# Patient Record
Sex: Female | Born: 1980 | Hispanic: Yes | Marital: Single | Smoking: Never smoker
Health system: Southern US, Community
[De-identification: ages and names within clinical notes are randomized; demographics above are authoritative.]

## PROBLEM LIST (undated history)

## (undated) ENCOUNTER — Inpatient Hospital Stay (HOSPITAL_COMMUNITY): Payer: Self-pay

## (undated) DIAGNOSIS — IMO0002 Reserved for concepts with insufficient information to code with codable children: Secondary | ICD-10-CM

## (undated) HISTORY — DX: Reserved for concepts with insufficient information to code with codable children: IMO0002

---

## 2001-08-30 ENCOUNTER — Ambulatory Visit (HOSPITAL_COMMUNITY): Admission: RE | Admit: 2001-08-30 | Discharge: 2001-08-30 | Payer: Self-pay | Admitting: *Deleted

## 2001-12-31 ENCOUNTER — Encounter (HOSPITAL_COMMUNITY): Admission: AD | Admit: 2001-12-31 | Discharge: 2001-12-31 | Payer: Self-pay | Admitting: *Deleted

## 2002-01-05 ENCOUNTER — Inpatient Hospital Stay (HOSPITAL_COMMUNITY): Admission: AD | Admit: 2002-01-05 | Discharge: 2002-01-09 | Payer: Self-pay | Admitting: *Deleted

## 2004-08-23 ENCOUNTER — Other Ambulatory Visit: Admission: RE | Admit: 2004-08-23 | Discharge: 2004-08-23 | Payer: Self-pay | Admitting: Gynecology

## 2005-09-01 ENCOUNTER — Other Ambulatory Visit: Admission: RE | Admit: 2005-09-01 | Discharge: 2005-09-01 | Payer: Self-pay | Admitting: Gynecology

## 2006-10-16 ENCOUNTER — Other Ambulatory Visit: Admission: RE | Admit: 2006-10-16 | Discharge: 2006-10-16 | Payer: Self-pay | Admitting: Gynecology

## 2008-06-20 ENCOUNTER — Encounter (INDEPENDENT_AMBULATORY_CARE_PROVIDER_SITE_OTHER): Payer: Self-pay | Admitting: Obstetrics and Gynecology

## 2008-06-20 ENCOUNTER — Inpatient Hospital Stay (HOSPITAL_COMMUNITY): Admission: RE | Admit: 2008-06-20 | Discharge: 2008-06-23 | Payer: Self-pay | Admitting: Obstetrics and Gynecology

## 2010-06-19 LAB — URINE MICROSCOPIC-ADD ON

## 2010-06-19 LAB — CBC
HCT: 29.9 % — ABNORMAL LOW (ref 36.0–46.0)
HCT: 39.1 % (ref 36.0–46.0)
Hemoglobin: 10.4 g/dL — ABNORMAL LOW (ref 12.0–15.0)
Hemoglobin: 13.6 g/dL (ref 12.0–15.0)
MCHC: 34.8 g/dL (ref 30.0–36.0)
MCHC: 35 g/dL (ref 30.0–36.0)
MCV: 96.6 fL (ref 78.0–100.0)
MCV: 97.4 fL (ref 78.0–100.0)
Platelets: 183 10*3/uL (ref 150–400)
Platelets: 216 10*3/uL (ref 150–400)
RBC: 3.07 MIL/uL — ABNORMAL LOW (ref 3.87–5.11)
RBC: 4.05 MIL/uL (ref 3.87–5.11)
RDW: 13.3 % (ref 11.5–15.5)
RDW: 13.6 % (ref 11.5–15.5)
WBC: 11.2 10*3/uL — ABNORMAL HIGH (ref 4.0–10.5)
WBC: 11.3 10*3/uL — ABNORMAL HIGH (ref 4.0–10.5)

## 2010-06-19 LAB — URINALYSIS, ROUTINE W REFLEX MICROSCOPIC
Bilirubin Urine: NEGATIVE
Glucose, UA: NEGATIVE mg/dL
Ketones, ur: 15 mg/dL — AB
Nitrite: NEGATIVE
Protein, ur: NEGATIVE mg/dL
Specific Gravity, Urine: 1.01 (ref 1.005–1.030)
Urobilinogen, UA: 0.2 mg/dL (ref 0.0–1.0)
pH: 6.5 (ref 5.0–8.0)

## 2010-06-19 LAB — RPR: RPR Ser Ql: NONREACTIVE

## 2010-07-23 NOTE — Op Note (Signed)
NAME:  Ashley Lopez, Ashley Lopez           ACCOUNT NO.:  [id number]   MEDICAL RECORD NO.:  [id number]          PATIENT TYPE:  INP   LOCATION:  9122                          FACILITY:  [facility]   PHYSICIAN:  Randye Lobo, M.D.   DATE OF BIRTH:  [birth year]   DATE OF PROCEDURE:  06/20/2008  DATE OF DISCHARGE:                               OPERATIVE REPORT   PREOPERATIVE DIAGNOSES:  1. Intrauterine gestation at 41 +1 weeks.  2. History of prior cesarean section.   POSTOPERATIVE DIAGNOSES:  1. Intrauterine gestation at 41 +1 weeks.  2. History of prior cesarean section.  3. Thick meconium-stained amniotic fluid.   PROCEDURE:  Repeat low segment transverse cesarean section.   SURGEON:  Randye Lobo, MD   ANESTHESIA:  Spinal.   IV FLUIDS:  3500 mL Ringer's lactate.   ESTIMATED BLOOD LOSS:  700 mL.   URINE OUTPUT:  300 mL.   COMPLICATIONS:  None.   INDICATIONS FOR PROCEDURE:  The patient is a 30 year old gravida 2, para  1-0-0-1 Hispanic female at 63 plus one weeks' gestation who now presents  post dates with absence of labor and the presence of an unfavorable  cervix.  The patient had a biophysical profile on April 9, which was 8/8  and estimated fetal weight at that time was 3260 g with an AFI of 16.85.  The patient's cervix was closed and 20% effaced.  The patient now  presents for a repeat cesarean section as she has not gone into  spontaneous labor and the patient agrees to proceed after risks,  benefits, and alternatives are reviewed.   FINDINGS:  A viable female was delivered at 14:32 with Apgars of 8 at one  minute and 9 at five minutes.  The weight was 7 pounds and 10 ounces.  The amniotic fluid had thick meconium staining.  The left fallopian tube  was adherent to the left anterior abdominal wall.  The fallopian tubes,  ovaries, and uterus were otherwise unremarkable.   SPECIMENS:  The placenta was sent to pathology.   DESCRIPTION OF PROCEDURE:  The patient was reidentified  in the  preoperative hold area.  She did receive Ancef for IV antibiotic  prophylaxis.  In the operating room, a spinal anesthetic was  administered and the patient was placed in the supine position with the  left lateral tilt.  The abdomen was sterilely prepped and the Foley  catheter was placed inside the bladder.  She was sterilely draped.   An Allis test was performed and the patient had adequate surgical  anesthesia.   A Pfannenstiel incision was created sharply with a scalpel along the  line of the patient's previous incision.  The incision was carried down  to the fascia using a sharp dissection with a scalpel.  The fascia was  incised in the midline.  The fascial incision was extended bilaterally  with a Mayo scissors.  The fascia was then dissected from the rectus  muscles superiorly and inferiorly.  The rectus muscles were divided.  The parietal peritoneum was entered, and the peritoneal incision was  extended cranially and caudally.  The lower uterine segment was exposed and a bladder flap was sharply  created.  The bladder flap was noted to be high along the lower uterine  segment.   A transverse lower uterine segment incision was then created sharply  with a scalpel.  The incision was extended bluntly.  Meconium stained  amniotic fluid was noted behind the membranes.  Membranes were ruptured  at this time.  A hand was inserted through the uterine incision and the  vertex was elevated.  Fundal pressure was applied; however the vertex  did not want to deliver with only fundal pressure.  A Mityvac was  therefore applied.  There were two pop offs.  Tissue dystocia from the  rectus muscles and the skin incision were appreciated and the rectus  muscles were therefore sharply partially bisected bilaterally and the  skin incision was also extended bilaterally.  The Mityvac was then  applied and the vertex was then delivered without difficulty.  The DeLee  suction was used to  suction the nares in the mouth.  The remainder of  the newborn was delivered, and the newborn was noted to be in vigorous  condition.  The umbilical cord was doubly clamped and cut.  The newborn  was carried over to the awaiting pediatricians.   The placenta was manually extracted and sent to pathology at this time.  The uterus was exteriorized at this time and it was wiped, cleaned with  a moistened lap pad.  The uterine incision was closed with a double  layer closure of #1 chromic.  The first was a running locked layer and  the second was an imbricating layer.  There was some bleeding of a  branch of the uterine artery on the patient's left-hand side.  A hand  was inserted behind the broad ligament and a O'Leary stitch with #1  chromic was performed without difficulty and this provided good  hemostasis.   The uterus was returned to the peritoneal cavity at this time.  The  adhesion between the fallopian tube and the peritoneum was appreciated  at this time and it was sharply lysed leaving some parietal peritoneum  on the of fallopian tube.  This was oozing slightly and was cauterized  very minimally.   The pelvis was irrigated and suctioned at this time and there was good  hemostasis and so the abdomen was therefore closed.  The parietal  peritoneum was closed with a running suture of 3-0 Vicryl.  The rectus  muscles were reconstructed with figure-of-eight sutures of #1 chromic.  The rectus muscles were then reapproximated in the midline with  interrupted and figure-of-eight sutures of #1 chromic.  The fascia was  next closed with a running suture of 0 Vicryl.   There was retraction of the subcutaneous layer and this was released by  undermining the subcutaneous tissue superiorly and inferiorly using  monopolar cautery to create good hemostasis.  The skin was then closed  with staples and a sterile pressure bandage was placed over this.   This concluded the patient's procedure.   There were no complications.  All needle, instrument, sponge counts were correct.  The patient was  escorted to the recovery room in stable and awake condition.      Randye Lobo, M.D.  Electronically Signed     BES/MEDQ  D:  06/20/2008  T:  06/21/2008  Job:  [id number]

## 2010-07-26 NOTE — Discharge Summary (Signed)
Ashley Lopez, Ashley Lopez           ACCOUNT NO.:  [id number]   MEDICAL RECORD NO.:  [id number]          PATIENT TYPE:  INP   LOCATION:  9122                          [facility]ACILITY:  [facility]   PHYSICIAN:  Randye Lobo, M.D.   DATE OF BIRTH:  [birth year]   DATE OF ADMISSION:  06/20/2008  DATE OF DISCHARGE:  06/23/2008                               DISCHARGE SUMMARY   FINAL DIAGNOSES:  1. Intrauterine gestation at 41-1/7 weeks' gestation.  2. History of prior cesarean section.  3. Unfavorable cervix.  4. Thick meconium-stained amniotic fluid.   PROCEDURE:  Repeat low transverse cesarean section.   SURGEON:  Randye Lobo, MD   COMPLICATIONS:  None.   This 30 year old G2, P1-0-0-1 presents at 41-1/7 weeks' gestation with a  history of a prior Cesarean Section and being post dates with an  unfavorable cervix.  The patient did have a biophysical profile on June 16, 2008, which was 8/8 and showed an estimated fetal weight of about  3260 g and a normal AFI.  The patient's cervix was closed.  After  consent was performed, the decision was made to proceed with the repeat  Cesarean section.  The patient's antepartum course up to this point had  been uncomplicated.  The patient was taken to the operating room on  June 20, 2008, by Dr. Conley Simmonds where a repeat low segment transverse  Cesarean section was performed with the delivery of a 7 pounds 10 ounces  female infant with Apgars of 8 and 9.  The left fallopian tube was  adherent to the left anterior abdominal wall.  Procedure went without  complications.  The patient's postoperative course was benign without  any significant fevers.  The patient was felt ready for discharge on  postoperative day #3.  She was sent home on a regular diet, told to  decrease activities, told to continue her prenatal vitamins, was given a  prescription for Percocet 1-2 every 4-6 hours as needed for pain, told  she could use ibuprofen up to 600 mg every 6 hours as  needed for pain,  was to follow up in our office in 4-6 weeks.  Instructions and  precautions were reviewed with the patient.   LABORATORY DATA ON DISCHARGE:  The patient had a hemoglobin of 10.4,  white blood cell count of 11.2, and platelets of 183,000.      Leilani Able, P.A.-C.      Randye Lobo, M.D.  Electronically Signed   MB/MEDQ  D:  07/17/2008  T:  07/18/2008  Job:  [id number]

## 2010-07-26 NOTE — Discharge Summary (Signed)
   Ashley Lopez, Ashley Lopez                              ACCOUNT NO.:  [id number]   MEDICAL RECORD NO.:  [id number]                   PATIENT TYPE:  INP   LOCATION:  9104                                 [facility]   PHYSICIAN:  Maryelizabeth Rowan, M.D.               DATE OF BIRTH:  01-Jun-1980   DATE OF ADMISSION:  01/05/2002  DATE OF DISCHARGE:  01/09/2002                                 DISCHARGE SUMMARY   DISCHARGE DIAGNOSIS:  Status post low transverse cesarean section with  delivery of term female baby.   HOSPITAL COURSE:  This 30 year old Hispanic female was admitted for  induction of labor and post dates.  Her course was complicated by arrest of  dilatation.  She was then taken to the OR for a low transverse C-section.  Postoperative course was uneventful.  The patient was up and ambulating  well.   PROCEDURE:  A low transverse C-section was performed on January 06, 2002.   CONDITION ON DISCHARGE:  The patient was discharged home in stable  condition.   DISCHARGE MEDICATIONS:  Depo-Provera injection prior to discharge, Percocet  5/325 one to two q.6h. p.r.n., and FES04 t.i.d., Colace 100 mg p.o. b.i.d.   FOLLOW UP:  Follow up at [facility] in six weeks.   DISCHARGE LABORATORY DATA:  Hemoglobin on January 06, 2002, of 9.7.                                               Maryelizabeth Rowan, M.D.    ED/MEDQ  D:  01/09/2002  T:  01/10/2002  Job:  [id number]

## 2010-07-26 NOTE — Op Note (Signed)
Ashley Lopez, Ashley Lopez                              ACCOUNT NO.:  [id number]   MEDICAL RECORD NO.:  [id number]                   PATIENT TYPE:  INP   LOCATION:  9104                                 [facility]ACILITY[facility]   PHYSICIAN:  Mary Sella. Orlene Erm, M.D.                 DATE OF BIRTH:  1980/03/13   DATE OF PROCEDURE:  01/06/2002  DATE OF DISCHARGE:                                 OPERATIVE REPORT   PREOPERATIVE DIAGNOSIS:  A 30 year old, gravida 1, at 41 weeks  4 days with  arrested dilatation.   POSTOPERATIVE DIAGNOSIS:  A 30 year old, gravida 1, at 41 weeks 4 days with  arrested dilatation.   PROCEDURE:  Primary low-transverse cesarean section.   SURGEON:  Mary Sella. Orlene Erm, M.D.   ASSISTANT:  Marlinda Mike, C.N.M.   ANESTHESIA:  Epidural.   COMPLICATIONS:  None.   ESTIMATED BLOOD LOSS:  800 cc.   FINDINGS:  A viable female infant delivered at 2158 with Apgars of  7/8,  weighing 6 pounds 8 ounces, normal uterus, tubes and ovaries.   DISPOSITION:  To recovery room, stable.   INDICATIONS FOR PROCEDURE:  The patient is a 30 year old Hispanic female who  presented on January 05, 2002, for induction of labor. The patient was begun  on Cervidil and continued to have her cervix ripen through January 06, 2002.  On that morning, she was 1 cm and 70%.  Artifical rupture of membranes was  performed which revealed thin meconium. The patient was internalized with  IUPC and Pitocin was begun. During the day, she had difficulty obtaining  adequate labor without having fetal heart rate deceleration. The patient was  eventually in adequate labor for several hours during the day; however, she  changed her cervix going to 5-6 cm. There was __________  of caput and  molding noted. The baby was in occiput transverse presentation. The decision  was made to perform primary low-transverse cesarean section secondary to  arrested dilatation and intolerance of labor. The patient was counseled on  the risk of  bleeding, infection, injury to internal organs, the risk of  transfusion, and emergent hysterectomy. The patient agrees to proceed.   DESCRIPTION OF PROCEDURE:  The patient was taken to the operating room where  epidural anesthesia was bolused. She was prepped and draped in a sterile  fashion. A Pfannenstiel incision was performed and carried down to the  underlying fascia with sharp and blunt dissection. The fascia was entered  sharply and dissected laterally with Mayo scissors. The fascia was separated  from the underlying rectus muscle with sharp and blunt dissection. The  rectus muscle bellies were separated and the peritoneum was grasped with the  hemostats. This was entered sharply with Metzenbaum scissors. The peritoneum  was dissected superiorly and inferiorly and the peritoneum was stretched.   The bladder blade was placed and bladder flaps were created with sharp  and  blunt dissection. The uterus was then scored with the scalpel and the  incision was carried down in the underlying uterine cavity in the midline.  The surgeon's fingers were used to extend the incision laterally and  superiorly. The head was grasped and delivered through the uterine incision.  The infant was bulb suctioned on the operative field. The infant's body was  delivered and cord was clamped and cut. The infant was then handed off to  the waiting neonatal resuscitation team.   The placenta was then manually extracted and the uterus was curetted with a  dry lap sponge. The uterus was externalized and the uterine incision was  repaired with a running locking suture of #0 chromic. A second imbricating  layered suture was used to repair the incision and obtain final hemostasis.  The uterus was returned to the abdominal cavity and then pericolic gutters  were emptied of clot and debris. The uterine incision was again inspected  and noted to be hemostatic.   The fascial incision was closed with a running stitch  of #0 Vicryl. The  subcutaneous tissue was irrigated with copious amounts of saline and the  skin edges were reapproximated with staples.  Sponge, needle, and instrument  counts were correct at the end of the procedure.   The patient tolerated the procedure well and returned to the recovery room  in stable condition.                                               Mary Sella. Orlene Erm, M.D.    EMH/MEDQ  D:  01/06/2002  T:  01/07/2002  Job:  [id number]

## 2010-09-18 ENCOUNTER — Other Ambulatory Visit: Payer: Self-pay | Admitting: Obstetrics and Gynecology

## 2011-12-16 ENCOUNTER — Other Ambulatory Visit: Payer: Self-pay

## 2012-10-19 ENCOUNTER — Ambulatory Visit: Payer: Self-pay | Admitting: Internal Medicine

## 2012-11-05 ENCOUNTER — Ambulatory Visit: Payer: Self-pay | Admitting: Internal Medicine

## 2012-12-09 ENCOUNTER — Telehealth: Payer: Self-pay

## 2012-12-09 NOTE — Telephone Encounter (Signed)
LM for C/B. 

## 2012-12-10 ENCOUNTER — Encounter: Payer: Self-pay | Admitting: Internal Medicine

## 2012-12-10 ENCOUNTER — Ambulatory Visit: Payer: Self-pay | Admitting: Internal Medicine

## 2012-12-10 ENCOUNTER — Ambulatory Visit (INDEPENDENT_AMBULATORY_CARE_PROVIDER_SITE_OTHER): Payer: BC Managed Care – PPO | Admitting: Internal Medicine

## 2012-12-10 VITALS — BP 100/66 | HR 82 | Temp 98.5°F | Ht 61.0 in | Wt 105.0 lb

## 2012-12-10 DIAGNOSIS — Z0000 Encounter for general adult medical examination without abnormal findings: Secondary | ICD-10-CM

## 2012-12-10 LAB — CBC WITH DIFFERENTIAL/PLATELET
Basophils Relative: 0.4 % (ref 0.0–3.0)
Eosinophils Absolute: 0 10*3/uL (ref 0.0–0.7)
Eosinophils Relative: 0.4 % (ref 0.0–5.0)
Hemoglobin: 12.8 g/dL (ref 12.0–15.0)
Lymphocytes Relative: 12 % (ref 12.0–46.0)
MCHC: 34.2 g/dL (ref 30.0–36.0)
MCV: 93.2 fl (ref 78.0–100.0)
Neutro Abs: 7.9 10*3/uL — ABNORMAL HIGH (ref 1.4–7.7)
Neutrophils Relative %: 81.6 % — ABNORMAL HIGH (ref 43.0–77.0)
RBC: 4.03 Mil/uL (ref 3.87–5.11)
WBC: 9.7 10*3/uL (ref 4.5–10.5)

## 2012-12-10 LAB — COMPREHENSIVE METABOLIC PANEL
Albumin: 3.9 g/dL (ref 3.5–5.2)
Alkaline Phosphatase: 27 U/L — ABNORMAL LOW (ref 39–117)
BUN: 11 mg/dL (ref 6–23)
Calcium: 8.7 mg/dL (ref 8.4–10.5)
Chloride: 104 mEq/L (ref 96–112)
Glucose, Bld: 77 mg/dL (ref 70–99)
Potassium: 3.3 mEq/L — ABNORMAL LOW (ref 3.5–5.1)
Sodium: 137 mEq/L (ref 135–145)
Total Protein: 7 g/dL (ref 6.0–8.3)

## 2012-12-10 LAB — LIPID PANEL
Cholesterol: 193 mg/dL (ref 0–200)
LDL Cholesterol: 107 mg/dL — ABNORMAL HIGH (ref 0–99)
Total CHOL/HDL Ratio: 3
Triglycerides: 116 mg/dL (ref 0.0–149.0)

## 2012-12-10 NOTE — Progress Notes (Signed)
  Subjective:    Patient ID: Ashley Lopez, female    DOB: [birth year], 32 y.o.   MRN: [id number]  HPI New patient , CPX  No past medical history on file.  Past Surgical History  Procedure Laterality Date  . Cesarean section      x 2   History   Social History  . Marital Status: Single    Spouse Name: N/A    Number of Children: 2  . Years of Education: N/A   Occupational History  . grocery store     Social History Main Topics  . Smoking status: Never Smoker   . Smokeless tobacco: Never Used  . Alcohol Use: No  . Drug Use: No  . Sexual Activity: Not on file   Other Topics Concern  . Not on file   Social History Narrative   Original from [place], [place]   Lives w/ boyfriend         Family History  Problem Relation Age of Onset  . Colon cancer Neg Hx   . Breast cancer Neg Hx   . CAD Father     ??  . Stroke Other     MGM  . Diabetes Neg Hx     Review of Systems Diet-- trying to eat healthy Exercise-- active at work No  CP, SOB, lower extremity edema Denies  nausea, vomiting diarrhea Denies  blood in the stools (-) cough, sputum production (-) wheezing, chest congestion No anxiety, depression  (-) polyurea, polydypsia       Objective:   Physical Exam BP 100/66  Pulse 82  Temp(Src) 98.5 F (36.9 C)  Ht 5\' 1"  (1.549 m)  Wt 105 lb (47.628 kg)  BMI 19.85 kg/m2  SpO2 99% General -- alert, well-developed, NAD.  Neck --no thyromegaly Lungs -- normal respiratory effort, no intercostal retractions, no accessory muscle use, and normal breath sounds.  Heart-- normal rate, regular rhythm, no murmur.  Abdomen-- Not distended, good bowel sounds,soft, non-tender.  Extremities-- no pretibial edema bilaterally  Neurologic--  alert & oriented X3. Speech normal, gait normal, strength normal in all extremities.   Psych-- Cognition and judgment appear intact. Cooperative with normal attention span and concentration. No anxious appearing , no depressed  appearing.       Assessment & Plan:

## 2012-12-10 NOTE — Telephone Encounter (Signed)
Unable to reach prior to visit  

## 2012-12-10 NOTE — Assessment & Plan Note (Signed)
Last Td --- ? 4 years ago when she was pregnant Had a flu shot at work Diet, exercise, calcium and vitamin D discussed Labs Has an appointment to see gynecology. She is somehow concerned about her weight, has lost a few pounds in the last few months, we agreed to do blood work and reassess in 3 months.

## 2012-12-11 ENCOUNTER — Encounter: Payer: Self-pay | Admitting: Internal Medicine

## 2012-12-15 ENCOUNTER — Telehealth: Payer: Self-pay | Admitting: *Deleted

## 2012-12-15 DIAGNOSIS — R7989 Other specified abnormal findings of blood chemistry: Secondary | ICD-10-CM

## 2012-12-15 NOTE — Telephone Encounter (Signed)
Pt scheduled for lab visit 12/17/12  [facility]

## 2012-12-15 NOTE — Addendum Note (Signed)
Addended by: Eustace Quail on: 12/15/2012 02:16 PM   Modules accepted: Orders

## 2012-12-15 NOTE — Telephone Encounter (Signed)
Message copied by Eustace Quail on Wed Dec 15, 2012  2:13 PM ------      Message from: Silvio Pate D      Created: Tue Dec 14, 2012  1:38 PM       This blood work is to old to add labs. The  Patient will have to come back in.       Silvio Pate      ----- Message -----         From: Wanda Plump, MD         Sent: 12/14/2012   1:21 PM           To: Vance Gather            Please add free T4, free T3 --- dx  abnormal TSH       ------

## 2012-12-17 ENCOUNTER — Encounter: Payer: Self-pay | Admitting: *Deleted

## 2012-12-17 ENCOUNTER — Other Ambulatory Visit (INDEPENDENT_AMBULATORY_CARE_PROVIDER_SITE_OTHER): Payer: BC Managed Care – PPO

## 2012-12-17 DIAGNOSIS — R6889 Other general symptoms and signs: Secondary | ICD-10-CM

## 2012-12-17 DIAGNOSIS — R7989 Other specified abnormal findings of blood chemistry: Secondary | ICD-10-CM

## 2012-12-18 LAB — T3, FREE: T3, Free: 3.2 pg/mL (ref 2.3–4.2)

## 2012-12-20 ENCOUNTER — Encounter: Payer: Self-pay | Admitting: *Deleted

## 2012-12-20 NOTE — Progress Notes (Signed)
Letter mailed to patient.

## 2012-12-24 ENCOUNTER — Other Ambulatory Visit: Payer: Self-pay

## 2013-03-16 ENCOUNTER — Other Ambulatory Visit: Payer: Self-pay

## 2013-03-18 ENCOUNTER — Ambulatory Visit: Payer: BC Managed Care – PPO | Admitting: Internal Medicine

## 2013-06-08 ENCOUNTER — Encounter: Payer: Self-pay | Admitting: Internal Medicine

## 2013-06-08 ENCOUNTER — Ambulatory Visit (INDEPENDENT_AMBULATORY_CARE_PROVIDER_SITE_OTHER): Payer: BC Managed Care – PPO | Admitting: Internal Medicine

## 2013-06-08 VITALS — BP 95/62 | HR 60 | Temp 97.7°F | Wt 104.0 lb

## 2013-06-08 DIAGNOSIS — R634 Abnormal weight loss: Secondary | ICD-10-CM | POA: Insufficient documentation

## 2013-06-08 DIAGNOSIS — Z0000 Encounter for general adult medical examination without abnormal findings: Secondary | ICD-10-CM

## 2013-06-08 DIAGNOSIS — Z23 Encounter for immunization: Secondary | ICD-10-CM

## 2013-06-08 LAB — T3, FREE: T3, Free: 3 pg/mL (ref 2.3–4.2)

## 2013-06-08 LAB — TSH: TSH: 0.4 u[IU]/mL (ref 0.35–5.50)

## 2013-06-08 LAB — HEMOGLOBIN A1C: HEMOGLOBIN A1C: 5.2 % (ref 4.6–6.5)

## 2013-06-08 LAB — T4, FREE: Free T4: 0.99 ng/dL (ref 0.60–1.60)

## 2013-06-08 LAB — POTASSIUM: Potassium: 4.1 mEq/L (ref 3.5–5.1)

## 2013-06-08 NOTE — Assessment & Plan Note (Signed)
[facility] Reports frequent vaginal infections, pt thinking about a referral to another gynecologist

## 2013-06-08 NOTE — Progress Notes (Signed)
Pre visit review using our clinic review tool, if applicable. No additional management support is needed unless otherwise documented below in the visit note. 

## 2013-06-08 NOTE — Patient Instructions (Signed)
Get your blood work before you leave   Next visit is for a physical exam by December 2014 ,  fasting Please make an appointment

## 2013-06-08 NOTE — Assessment & Plan Note (Addendum)
Patient report weight loss, per our scales has lost 1 pound since December 2014. She admits to eating small portions, also has some anxiety. TSH was slightly low, does no have any symptoms of hyperthyroidism. Plan: TFTs, A1c per patient request, recheck potassium which was a slightly low. Counseled about anxiety, see a counselor? Pt declines to take any meds

## 2013-06-08 NOTE — Progress Notes (Signed)
   Subjective:    Patient ID: Ashley PandyElvia Lopez, female    DOB: 05/18/1980, 33 y.o.   MRN: [id number]  DOS:  06/08/2013 Type of  visit:  Followup from previous visit   Since the last time she was here, she continued to be concerned about her weight. States she's taking vitamins, admits that she is still eating small portions. On further questioning, she admits to anxiety, mostly related to her relationship with her boyfriend (denies b-friend to be violent or abusive) .   ROS Denies depression or suicidal ideas. Denies diarrhea or tremors States has been losing her hair. Reports frequent vaginal infections, sees gynecology    History reviewed. No pertinent past medical history.  Past Surgical History  Procedure Laterality Date  . Cesarean section      x 2    History   Social History  . Marital Status: Single    Spouse Name: N/A    Number of Children: 2  . Years of Education: N/A   Occupational History  . grocery store     Social History Main Topics  . Smoking status: Never Smoker   . Smokeless tobacco: Never Used  . Alcohol Use: No  . Drug Use: No  . Sexual Activity: Not on file   Other Topics Concern  . Not on file   Social History Narrative   Original from [place]   Lives w/ boyfriend              Medication List       This list is accurate as of: 06/08/13 12:58 PM.  Always use your most recent med list.               REPHRESH PRO-B PO  Take by mouth.           Objective:   Physical Exam BP 95/62  Pulse 60  Temp(Src) 97.7 F (36.5 C)  Wt 104 lb (47.174 kg)  SpO2 100% General -- alert, well-developed, NAD.  Neck --no thyromegaly   Extremities-- no pretibial edema bilaterally  Neurologic--  alert & oriented X3. Speech normal, gait normal, strength normal in all extremities.  No tremors  Psych-- Cognition and judgment appear intact. Cooperative with normal attention span and concentration. No anxious or depressed appearing.      Assessment & Plan:

## 2013-06-09 ENCOUNTER — Encounter: Payer: Self-pay | Admitting: *Deleted

## 2013-12-08 DIAGNOSIS — R87810 Cervical high risk human papillomavirus (HPV) DNA test positive: Secondary | ICD-10-CM | POA: Insufficient documentation

## 2013-12-29 ENCOUNTER — Other Ambulatory Visit: Payer: Self-pay

## 2013-12-30 LAB — CYTOLOGY - PAP

## 2014-01-04 ENCOUNTER — Inpatient Hospital Stay (HOSPITAL_COMMUNITY): Payer: BC Managed Care – PPO

## 2014-01-04 ENCOUNTER — Encounter (HOSPITAL_COMMUNITY): Payer: Self-pay | Admitting: *Deleted

## 2014-01-04 ENCOUNTER — Inpatient Hospital Stay (HOSPITAL_COMMUNITY)
Admission: AD | Admit: 2014-01-04 | Discharge: 2014-01-04 | Disposition: A | Discharge status: Home / Self Care | Payer: BC Managed Care – PPO | Source: Ambulatory Visit | Attending: Obstetrics | Admitting: Obstetrics

## 2014-01-04 DIAGNOSIS — N939 Abnormal uterine and vaginal bleeding, unspecified: Secondary | ICD-10-CM

## 2014-01-04 DIAGNOSIS — Z3A12 12 weeks gestation of pregnancy: Secondary | ICD-10-CM | POA: Diagnosis not present

## 2014-01-04 DIAGNOSIS — O209 Hemorrhage in early pregnancy, unspecified: Secondary | ICD-10-CM | POA: Diagnosis not present

## 2014-01-04 LAB — ABO/RH: ABO/RH(D): O POS

## 2014-01-04 NOTE — MAU Provider Note (Signed)
History     CSN: [id number]  Arrival date and time: 01/04/14 [id number]   First Provider Initiated Contact with Patient 01/04/14 1011      Chief Complaint  Patient presents with  . Abdominal Pain  . Vaginal Bleeding   HPI  [name] is a 33 y.o.female G3P2002 at 4370w4d who presents with vaginal bleeding and abdominal pain that started last night/ this morning around 0200.  She was seen by the RN in her OB dr this morning and was instructed to come to MAU for further evaluation. The office did not have anyone available to do an US so she was sent here for US. No recent intercourse. She attests to occasional abdominal cramping.  Hospital interpretor at bedside.   OB History   Grav Para Term Preterm Abortions TAB SAB Ect Mult Living   3 2 2       2       History reviewed. No pertinent past medical history.  Past Surgical History  Procedure Laterality Date  . Cesarean section      x 2    Family History  Problem Relation Age of Onset  . Colon cancer Neg Hx   . Breast cancer Neg Hx   . CAD Father     ??  . Stroke Other     MGM  . Diabetes Neg Hx     History  Substance Use Topics  . Smoking status: Never Smoker   . Smokeless tobacco: Never Used  . Alcohol Use: No    Allergies: No Known Allergies  Prescriptions prior to admission  Medication Sig Dispense Refill  . metroNIDAZOLE (FLAGYL) 500 MG tablet Take 500 mg by mouth 3 (three) times daily. Pt started on 01/02/14 to take for 7 days.      . Prenatal Vit-Fe Fumarate-FA (PRENATAL MULTIVITAMIN) TABS tablet Take 1 tablet by mouth daily at 12 noon.       Results for orders placed during the hospital encounter of 01/04/14 (from the past 48 hour(s))  ABO/RH     Status: None   Collection Time    01/04/14 10:50 AM      Result Value Ref Range   ABO/RH(D) O POS      Results for orders placed during the hospital encounter of 01/04/14 (from the past 48 hour(s))  ABO/RH     Status: None   Collection Time   01/04/14 10:50 AM      Result Value Ref Range   ABO/RH(D) O POS     [place] Ob Comp Less 14 Wks  01/04/2014   CLINICAL DATA:  Pregnant, vaginal bleeding  EXAM: OBSTETRIC <14 WK ULTRASOUND  TECHNIQUE: Transabdominal ultrasound was performed for evaluation of the gestation as well as the maternal uterus and adnexal regions.  COMPARISON:  None.  FINDINGS: Intrauterine gestational sac: Visualized/normal in shape.  Yolk sac:  Not visualized  Embryo:  Present  Cardiac Activity: Present  Heart Rate: 167 bpm  CRL:   56.3  mm   12 w 2 d                  US EDC: 07/17/2014  Maternal uterus/adnexae: No subchronic hemorrhage.  Right ovary is within normal limits, measuring 2.4 x 2.0 x 1.3 cm.  Left ovary is within normal limits, measuring 2.9 x 2.1 x 1.8 cm.  No free fluid.  IMPRESSION: Single live intrauterine gestation, with estimated gestational age [redacted] weeks 2 days by crown-rump length.   Electronically Signed  By: Charline BillsSriyesh  Krishnan M.D.   On: 01/04/2014 11:54    Review of Systems  Constitutional: Negative for fever and chills.  Gastrointestinal: Positive for abdominal pain (Lower abdominal pain ).  Genitourinary: Negative for dysuria and urgency.       +vaginal discharge mucus like.  + vaginal bleeding. No dysuria.    Physical Exam   Blood pressure 111/70, pulse 98, temperature 98.7 F (37.1 C), temperature source Oral, resp. rate 16, height 5' (1.524 m), weight 46.72 kg (103 lb), SpO2 100.00%.  Physical Exam  Constitutional: She is oriented to person, place, and time. She appears well-developed and well-nourished. No distress.  HENT:  Head: Normocephalic.  Eyes: Pupils are equal, round, and reactive to light.  Neck: Neck supple.  Respiratory: Effort normal.  GI: Soft.  Genitourinary:  Speculum exam: Vagina - Small amount of mucus like, dark red blood in vaginal canal  Cervix - No active bleeding from the cervix Bimanual exam: deferred  Chaperone present for exam.   Musculoskeletal: Normal  range of motion.  Neurological: She is alert and oriented to person, place, and time.  Skin: Skin is warm. She is not diaphoretic.  Psychiatric: Her behavior is normal.    MAU Course  Procedures None  MDM +fht ABO US Discussed plan of care with Dr. Chestine Sporelark. Patient currently in US @1115  Assessment and Plan   A: 1. Vaginal bleeding in first trimester    P: Discharge home in stable condition Bleeding precautions discussed Pelvic rest Return to MAU if symptoms worsen.     Iona HansenJennifer Irene Rasch, NP 01/04/2014 7:21 PM

## 2014-01-04 NOTE — MAU Note (Signed)
Pt coming from MD office.  Bleeding started on Mon, became heavy last night. Having sharp pains in lower abd, not really that bad

## 2014-01-08 ENCOUNTER — Encounter (HOSPITAL_COMMUNITY): Payer: Self-pay | Admitting: *Deleted

## 2014-01-08 ENCOUNTER — Inpatient Hospital Stay (HOSPITAL_COMMUNITY): Payer: BC Managed Care – PPO

## 2014-01-08 ENCOUNTER — Inpatient Hospital Stay (HOSPITAL_COMMUNITY)
Admission: AD | Admit: 2014-01-08 | Discharge: 2014-01-08 | Disposition: A | Discharge status: Home / Self Care | Payer: BC Managed Care – PPO | Source: Ambulatory Visit | Attending: Gynecology | Admitting: Gynecology

## 2014-01-08 DIAGNOSIS — O039 Complete or unspecified spontaneous abortion without complication: Secondary | ICD-10-CM | POA: Diagnosis not present

## 2014-01-08 DIAGNOSIS — Z3A13 13 weeks gestation of pregnancy: Secondary | ICD-10-CM | POA: Diagnosis not present

## 2014-01-08 LAB — CBC
HCT: 32.1 % — ABNORMAL LOW (ref 36.0–46.0)
HEMOGLOBIN: 11.3 g/dL — AB (ref 12.0–15.0)
MCH: 30.9 pg (ref 26.0–34.0)
MCHC: 35.2 g/dL (ref 30.0–36.0)
MCV: 87.7 fL (ref 78.0–100.0)
Platelets: 313 10*3/uL (ref 150–400)
RBC: 3.66 MIL/uL — ABNORMAL LOW (ref 3.87–5.11)
RDW: 11.9 % (ref 11.5–15.5)
WBC: 16.3 10*3/uL — ABNORMAL HIGH (ref 4.0–10.5)

## 2014-01-08 NOTE — MAU Note (Signed)
Discussed funeral arrangements with patient, she chose to take the baby home, form signed and attached to chart.  Number given to call to make sure she buries the baby properly.  Memory box and support information given to patient as well.

## 2014-01-08 NOTE — MAU Provider Note (Signed)
Chief Complaint: Miscarriage   First Provider Initiated Contact with Patient 01/08/14 0945     SUBJECTIVE HPI: Ashley Lopez is a 33 y.o. G3P2002 at 9775w1d by LMP who presents with SAB. At 08 100 she passed a fetus which she brought with her. No cord or placenta noted. She states bleeding is scant and she has minimal abdominal cramping. She was initially seen here on 01/04/2014 after office evaluation for vaginal bleeding and abdominal pain. On exam there was no active bleeding from cervix, US normal and Doptone fetal heart tones obtained.   History reviewed. No pertinent past medical history. OB History  Gravida Para Term Preterm AB SAB TAB Ectopic Multiple Living  3 2 2       2     # Outcome Date GA Lbr Len/2nd Weight Sex Delivery Anes PTL Lv  3 Current           2 Term      CS-LTranv   Y  1 Term      CS-LTranv   Y     Past Surgical History  Procedure Laterality Date  . Cesarean section      x 2   History   Social History  . Marital Status: Single    Spouse Name: N/A    Number of Children: 2  . Years of Education: N/A   Occupational History  . grocery store     Social History Main Topics  . Smoking status: Never Smoker   . Smokeless tobacco: Never Used  . Alcohol Use: No  . Drug Use: No  . Sexual Activity: Yes    Birth Control/ Protection: None   Other Topics Concern  . Not on file   Social History Narrative   Original from [place]   Lives w/ boyfriend         No current facility-administered medications on file prior to encounter.   Current Outpatient Prescriptions on File Prior to Encounter  Medication Sig Dispense Refill  . metroNIDAZOLE (FLAGYL) 500 MG tablet Take 500 mg by mouth 3 (three) times daily. Pt started on 01/02/14 to take for 7 days.    . Prenatal Vit-Fe Fumarate-FA (PRENATAL MULTIVITAMIN) TABS tablet Take 1 tablet by mouth daily at 12 noon.     No Known Allergies  ROS: Pertinent items in HPI  OBJECTIVE Blood pressure 114/69,  pulse 102, temperature 98.1 F (36.7 C), temperature source Oral, resp. rate 16. GENERAL: Well-developed, well-nourished female in no acute distress.  HEENT: Normocephalic HEART: normal rate RESP: normal effort ABDOMEN: Soft, non-tender EXTREMITIES: Nontender, no edema NEURO: Alert and oriented SPECULUM EXAM: NEFG, scant dark blood noted with no active bleeding. With fundal massage and sponge stick in cervical canal, retrieved what appears to be cord and placental tissue BIMANUAL: cervix 1/long; uterus NT, 12 wk size, no adnexal tenderness or masses  LAB RESULTS Results for orders placed or performed during the hospital encounter of 01/08/14 (from the past 24 hour(s))  CBC     Status: Abnormal   Collection Time: 01/08/14 12:32 PM  Result Value Ref Range   WBC 16.3 (H) 4.0 - 10.5 K/uL   RBC 3.66 (L) 3.87 - 5.11 MIL/uL   Hemoglobin 11.3 (L) 12.0 - 15.0 g/dL   HCT 16.132.1 (L) 09.636.0 - 04.546.0 %   MCV 87.7 78.0 - 100.0 fL   MCH 30.9 26.0 - 34.0 pg   MCHC 35.2 30.0 - 36.0 g/dL   RDW 40.911.9 81.111.5 - 91.415.5 %   Platelets 313  150 - 400 K/uL    IMAGING [place] Ob Comp Less 14 Wks  01/04/2014   CLINICAL DATA:  Pregnant, vaginal bleeding  EXAM: OBSTETRIC <14 WK ULTRASOUND  TECHNIQUE: Transabdominal ultrasound was performed for evaluation of the gestation as well as the maternal uterus and adnexal regions.  COMPARISON:  None.  FINDINGS: Intrauterine gestational sac: Visualized/normal in shape.  Yolk sac:  Not visualized  Embryo:  Present  Cardiac Activity: Present  Heart Rate: 167 bpm  CRL:   56.3  mm   12 w 2 d                  US EDC: 07/17/2014  Maternal uterus/adnexae: No subchronic hemorrhage.  Right ovary is within normal limits, measuring 2.4 x 2.0 x 1.3 cm.  Left ovary is within normal limits, measuring 2.9 x 2.1 x 1.8 cm.  No free fluid.  IMPRESSION: Single live intrauterine gestation, with estimated gestational age [redacted] weeks 2 days by crown-rump length.   Electronically Signed   By: Charline BillsSriyesh  Krishnan M.D.    On: 01/04/2014 11:54  Fax: [phone] [place] Pelvis Limited  01/08/2014   CLINICAL DATA:  Retained products of conception .  EXAM: LIMITED ULTRASOUND OF PELVIS  TECHNIQUE: Limited transabdominal ultrasound examination of the pelvis was performed.  COMPARISON:  None.  FINDINGS: Limited pelvic ultrasound reveals retained products of conception with what appears to be via placenta along the of posterior wall. Endometrial fluid is noted. No other focal abnormality. Report phoned to the patient's caregiver at the time of the study.  IMPRESSION: Findings suggest retained products of conception with what appears to be the placenta along the posterior wall.   Electronically Signed   By: Maisie Fushomas  Register   On: 01/08/2014 12:34    MAU COURSE C/W Dr. Audie BoxFontaine and discussed US result> If hgb stable, d/c/ home   ASSESSMENT 1. SAB (spontaneous abortion)   Questionable incomplete AB  PLAN Discharge home with bleeding precautions    Medication List    STOP taking these medications        metroNIDAZOLE 500 MG tablet  Commonly known as:  FLAGYL      TAKE these medications        prenatal multivitamin Tabs tablet  Take 1 tablet by mouth daily at 12 noon.       Follow-up Information    Follow up with Dara LordsFONTAINE,TIMOTHY P, MD. Schedule an appointment as soon as possible for a visit in 1 day.   Specialty:  Gynecology   Why:  Call tomorrow morning for appointment tomorrow   Contact information:   [address] [place] [id number] [phone]       Danae Orleanseirdre C Nyshaun Standage, CNM 01/08/2014  10:00 AM

## 2014-01-08 NOTE — MAU Note (Signed)
Pt here with fetus in box. Delivered at home around 0800. Did have issues with bleeding however u/s wnl. Had cramping last pm as well as bleeding.

## 2014-01-08 NOTE — Discharge Instructions (Signed)
Incomplete Miscarriage A miscarriage is the sudden loss of an unborn baby (fetus) before the 20th week of pregnancy. In an incomplete miscarriage, parts of the fetus or placenta (afterbirth) remain in the body.  Having a miscarriage can be an emotional experience. Talk with your health care provider about any questions you may have about miscarrying, the grieving process, and your future pregnancy plans. CAUSES   Problems with the fetal chromosomes that make it impossible for the baby to develop normally. Problems with the baby's genes or chromosomes are most often the result of errors that occur by chance as the embryo divides and grows. The problems are not inherited from the parents.  Infection of the cervix or uterus.  Hormone problems.  Problems with the cervix, such as having an incompetent cervix. This is when the tissue in the cervix is not strong enough to hold the pregnancy.  Problems with the uterus, such as an abnormally shaped uterus, uterine fibroids, or congenital abnormalities.  Certain medical conditions.  Smoking, drinking alcohol, or taking illegal drugs.  Trauma. SYMPTOMS   Vaginal bleeding or spotting, with or without cramps or pain.  Pain or cramping in the abdomen or lower back.  Passing fluid, tissue, or blood clots from the vagina. DIAGNOSIS  Your health care provider will perform a physical exam. You may also have an ultrasound to confirm the miscarriage. Blood or urine tests may also be ordered. TREATMENT   Usually, a dilation and curettage (D&C) procedure is performed. During a D&C procedure, the cervix is widened (dilated) and any remaining fetal or placental tissue is gently removed from the uterus.  Antibiotic medicines are prescribed if there is an infection. Other medicines may be given to reduce the size of the uterus (contract) if there is a lot of bleeding.  If you have Rh negative blood and your baby was Rh positive, you will need a Rho (D)  immune globulin shot. This shot will protect any future baby from having Rh blood problems in future pregnancies.  You may be confined to bed rest. This means you should stay in bed and only get up to use the bathroom. HOME CARE INSTRUCTIONS   Rest as directed by your health care provider.  Restrict activity as directed by your health care provider. You may be allowed to continue light activity if curettage was not done but you require further treatment.  Keep track of the number of pads you use each day. Keep track of how soaked (saturated) they are. Record this information.  Do not  use tampons.  Do not douche or have sexual intercourse until approved by your health care provider.  Keep all follow-up appointments for reevaluation and continuing management.  Only take over-the-counter or prescription medicines for pain, fever, or discomfort as directed by your health care provider.  Take antibiotic medicine as directed by your health care provider. Make sure you finish it even if you start to feel better. SEEK IMMEDIATE MEDICAL CARE IF:   You experience severe cramps in your stomach, back, or abdomen.  You have an unexplained temperature (make sure to record these temperatures).  You pass large clots or tissue (save these for your health care provider to inspect).  Your bleeding increases.  You become light-headed, weak, or have fainting episodes. MAKE SURE YOU:   Understand these instructions.  Will watch your condition.  Will get help right away if you are not doing well or get worse. Document Released: 02/24/2005 Document Revised: 07/11/2013 Document Reviewed:   09/23/2012 ExitCare Patient Information 2015 [employer]. This information is not intended to replace advice given to you by your health care provider. Make sure you discuss any questions you have with your health care provider.  

## 2014-01-09 ENCOUNTER — Encounter (HOSPITAL_COMMUNITY): Payer: Self-pay | Admitting: *Deleted

## 2014-02-13 ENCOUNTER — Other Ambulatory Visit: Payer: Self-pay

## 2014-02-16 ENCOUNTER — Encounter: Payer: BC Managed Care – PPO | Admitting: Internal Medicine

## 2014-03-22 ENCOUNTER — Encounter: Payer: Self-pay | Admitting: Internal Medicine

## 2014-03-22 ENCOUNTER — Ambulatory Visit (INDEPENDENT_AMBULATORY_CARE_PROVIDER_SITE_OTHER): Payer: BLUE CROSS/BLUE SHIELD | Admitting: Internal Medicine

## 2014-03-22 VITALS — BP 99/66 | HR 80 | Temp 98.2°F | Ht 60.0 in | Wt 109.1 lb

## 2014-03-22 DIAGNOSIS — Z0000 Encounter for general adult medical examination without abnormal findings: Secondary | ICD-10-CM

## 2014-03-22 NOTE — Progress Notes (Signed)
Pre visit review using our clinic review tool, if applicable. No additional management support is needed unless otherwise documented below in the visit note. 

## 2014-03-22 NOTE — Progress Notes (Signed)
   Subjective:    Patient ID: Ashley Lopez, female    DOB: 02/21/1981, 34 y.o.   MRN: [id number]  DOS:  03/22/2014 Type of visit - description : cpx Interval history: Since the last visit, she had a miscarriage 01-2014. Also separated from her boyfriend. Fortunately she feels well, is gaining weight, denies depression or anxiety.   ROS Denies chest pain or difficulty breathing No nausea, vomiting, diarrhea or blood in the stools. No dysuria, gross hematuria or difficulty urinating.  History reviewed. No pertinent past medical history.  Past Surgical History  Procedure Laterality Date  . Cesarean section      x 2    History   Social History  . Marital Status: Single    Spouse Name: N/A    Number of Children: 2  . Years of Education: N/A   Occupational History  . grocery store     Social History Main Topics  . Smoking status: Never Smoker   . Smokeless tobacco: Never Used  . Alcohol Use: No  . Drug Use: No  . Sexual Activity: Yes    Birth Control/ Protection: None   Other Topics Concern  . Not on file   Social History Narrative   Original from [place]   Lives w/ 2 children   G3P2           Family History  Problem Relation Age of Onset  . Colon cancer Neg Hx   . Breast cancer Neg Hx   . CAD Father     ??  . Stroke Other     MGM  . Diabetes Neg Hx        Medication List    Notice  As of 03/22/2014 11:59 PM   You have not been prescribed any medications.         Objective:   Physical Exam BP 99/66 mmHg  Pulse 80  Temp(Src) 98.2 F (36.8 C) (Oral)  Ht 5' (1.524 m)  Wt 109 lb 2 oz (49.499 kg)  BMI 21.31 kg/m2  SpO2 100%  LMP 02/28/2014 (Exact Date)  Breastfeeding? No General -- alert, well-developed, NAD.  Neck --no thyromegaly , normal carotid pulse  HEENT-- Not pale.   Lungs -- normal respiratory effort, no intercostal retractions, no accessory muscle use, and normal breath sounds.  Heart-- normal rate, regular rhythm, no  murmur.  Abdomen-- Not distended, good bowel sounds,soft, non-tender. Extremities-- no pretibial edema bilaterally  Neurologic--  alert & oriented X3. Speech normal, gait appropriate for age, strength symmetric and appropriate for age.    Psych-- Cognition and judgment appear intact. Cooperative with normal attention span and concentration. No anxious or depressed appearing.      Assessment & Plan:

## 2014-03-22 NOTE — Patient Instructions (Signed)
Get your blood work before you leave    Please come back to the office in 1 year  for a physical exam. Come back fasting    

## 2014-03-22 NOTE — Assessment & Plan Note (Signed)
Td 2015 Flu shot @ [facility] Had a miscarriage ~ 01-2014, LMP 02-28-14. Labs Diet-exercise discussed

## 2014-03-23 LAB — CBC WITH DIFFERENTIAL/PLATELET
Basophils Absolute: 0 10*3/uL (ref 0.0–0.1)
Basophils Relative: 0.4 % (ref 0.0–3.0)
Eosinophils Absolute: 0.1 10*3/uL (ref 0.0–0.7)
Eosinophils Relative: 1.5 % (ref 0.0–5.0)
HCT: 36.4 % (ref 36.0–46.0)
Hemoglobin: 12.2 g/dL (ref 12.0–15.0)
LYMPHS ABS: 2 10*3/uL (ref 0.7–4.0)
Lymphocytes Relative: 32.3 % (ref 12.0–46.0)
MCHC: 33.4 g/dL (ref 30.0–36.0)
MCV: 89.8 fl (ref 78.0–100.0)
Monocytes Absolute: 0.3 10*3/uL (ref 0.1–1.0)
Monocytes Relative: 4.9 % (ref 3.0–12.0)
Neutro Abs: 3.8 10*3/uL (ref 1.4–7.7)
Neutrophils Relative %: 60.9 % (ref 43.0–77.0)
PLATELETS: 235 10*3/uL (ref 150.0–400.0)
RBC: 4.06 Mil/uL (ref 3.87–5.11)
RDW: 13.7 % (ref 11.5–15.5)
WBC: 6.2 10*3/uL (ref 4.0–10.5)

## 2014-03-23 LAB — CHOLESTEROL, TOTAL: Cholesterol: 185 mg/dL (ref 0–200)

## 2014-03-23 LAB — BASIC METABOLIC PANEL
BUN: 16 mg/dL (ref 6–23)
CHLORIDE: 103 meq/L (ref 96–112)
CO2: 29 mEq/L (ref 19–32)
Calcium: 8.9 mg/dL (ref 8.4–10.5)
Creatinine, Ser: 0.58 mg/dL (ref 0.40–1.20)
GFR: 126.53 mL/min (ref >60.00)
Glucose, Bld: 80 mg/dL (ref 70–99)
Potassium: 3.9 mEq/L (ref 3.5–5.1)
SODIUM: 136 meq/L (ref 135–145)

## 2014-05-24 ENCOUNTER — Ambulatory Visit (INDEPENDENT_AMBULATORY_CARE_PROVIDER_SITE_OTHER): Payer: BLUE CROSS/BLUE SHIELD | Admitting: Internal Medicine

## 2014-05-24 VITALS — BP 112/62 | HR 71 | Temp 98.0°F | Ht 60.0 in | Wt 103.2 lb

## 2014-05-24 DIAGNOSIS — R21 Rash and other nonspecific skin eruption: Secondary | ICD-10-CM

## 2014-05-24 DIAGNOSIS — O039 Complete or unspecified spontaneous abortion without complication: Secondary | ICD-10-CM | POA: Insufficient documentation

## 2014-05-24 MED ORDER — KETOCONAZOLE 2 % EX CREA: 1.0000 "application " | TOPICAL_CREAM | Freq: Two times a day (BID) | CUTANEOUS | Status: DC

## 2014-05-24 NOTE — Patient Instructions (Signed)
APLIQUESE LA CREMA 2 VECES AL DIA POR 2 SEMANAS SI NO SE MEJORA EN LOS SIGUIENTES DIAS, POR FAVOR LLAMENOS

## 2014-05-24 NOTE — Progress Notes (Signed)
   Subjective:    Patient ID: Ashley Lopez, female    DOB: 02/26/1981, 34 y.o.   MRN: [id number]  DOS:  05/24/2014 Type of visit - description : acute Interval history: On and off rash, typically it starts very small and grows, as it expands, the center becomes dry. Very itchy. This time, symptoms started 2 weeks ago and has 2 lesions   Review of Systems No fever chills No insect bites  History reviewed. No pertinent past medical history.  Past Surgical History  Procedure Laterality Date  . Cesarean section      x 2    History   Social History  . Marital Status: Single    Spouse Name: N/A  . Number of Children: 2  . Years of Education: N/A   Occupational History  . grocery store     Social History Main Topics  . Smoking status: Never Smoker   . Smokeless tobacco: Never Used  . Alcohol Use: No  . Drug Use: No  . Sexual Activity: Yes    Birth Control/ Protection: None   Other Topics Concern  . Not on file   Social History Narrative   [place] from [place]   Lives w/ 2 children   G3P2              Medication List       This list is accurate as of: 05/24/14 11:59 PM.  Always use your most recent med list.               ketoconazole 2 % cream  Commonly known as:  NIZORAL  Apply 1 application topically 2 (two) times daily.           Objective:   Physical Exam  Constitutional: She is oriented to person, place, and time. She appears well-developed and well-nourished. No distress.  Neurological: She is alert and oriented to person, place, and time.  Skin: She is not diaphoretic.     Psychiatric: She has a normal mood and affect. Her behavior is normal. Judgment and thought content normal.   BP 112/62 mmHg  Pulse 71  Temp(Src) 98 F (36.7 C) (Oral)  Ht 5' (1.524 m)  Wt 103 lb 4 oz (46.834 kg)  BMI 20.16 kg/m2  SpO2 97%  LMP 05/07/2014 (Approximate)       Assessment & Plan:    Fungal infection, Findings consistent with a  fungal infection, will treat with ketoconazole twice a day for 2 weeks. One of the lesions has mild redness around it, if she is not responding well to the treatment is advised to call me, cellulitis?

## 2014-07-07 ENCOUNTER — Other Ambulatory Visit: Payer: Self-pay | Admitting: Internal Medicine

## 2014-07-07 NOTE — Telephone Encounter (Signed)
Pt is requesting refill on Ketoconazole. Last Filled on 05/24/2014. Okay to refill?

## 2014-07-07 NOTE — Telephone Encounter (Signed)
Ok x 1

## 2014-07-07 NOTE — Telephone Encounter (Signed)
Rx sent to [facility] pharmacy.  

## 2014-08-04 ENCOUNTER — Telehealth: Payer: Self-pay | Admitting: Internal Medicine

## 2014-08-04 MED ORDER — TERBINAFINE HCL 1 % EX CREA: 1.0000 "application " | TOPICAL_CREAM | Freq: Two times a day (BID) | CUTANEOUS | Status: DC

## 2014-08-04 NOTE — Telephone Encounter (Signed)
I recommend: Trial with a different cream, prescription sent, use twice a day for 10 days. If not better simply call and we will arrange a referral. I don't know of a Spanish speaking dermatologist however she can always go with a translator

## 2014-08-04 NOTE — Telephone Encounter (Signed)
Caller name: Voula Relation to pt: self Call back number: [phone] Pharmacy:  Reason for call:   Patient states that she still has the rash from last visit and is requesting to be referred to a spanish speaking dermatologist(if there is one)

## 2014-08-04 NOTE — Telephone Encounter (Signed)
FYI. Please advise.

## 2014-08-04 NOTE — Telephone Encounter (Signed)
LMOM informing Pt of Dr. Drue NovelPaz recommendations. Informed her that [facility] [address] Informed her that she will use cream twice daily on rash. Instructed her to call next week if rash is not improving that we can refer her to dermatology. Informed her that Dr. Drue NovelPaz does not know of any Spanish speaking dermatologists but that they should be able to set up translator for her if needed. Instructed Pt to return call if she has any questions.

## 2014-09-04 ENCOUNTER — Telehealth: Payer: Self-pay | Admitting: Internal Medicine

## 2014-09-04 DIAGNOSIS — R21 Rash and other nonspecific skin eruption: Secondary | ICD-10-CM

## 2014-09-04 NOTE — Telephone Encounter (Signed)
Dr. Drue Novel would like for Pt to see a dermatologist. Please inform Pt that I will place a referral, she should hear from their office to schedule an appt.

## 2014-09-04 NOTE — Telephone Encounter (Signed)
No,  please refer to a dermatologist, will need to translator Dx rash

## 2014-09-04 NOTE — Telephone Encounter (Signed)
Caller name: Monserrat Relation to pt: self Call back nuJamas Lavmber: [phone] [facility] [address]  Reason for call: Pt is wanting to ask for rx terbinafine (LAMISIL) 1 % cream . Please advise.

## 2014-09-04 NOTE — Telephone Encounter (Signed)
Please advise.     Okay to refill?

## 2014-09-05 ENCOUNTER — Encounter: Payer: Self-pay | Admitting: Internal Medicine

## 2014-09-14 NOTE — Telephone Encounter (Signed)
Called pt and informed the below, pt understood and will wait for dermatologist to call for appt.

## 2015-01-17 ENCOUNTER — Other Ambulatory Visit: Payer: Self-pay | Admitting: Obstetrics and Gynecology

## 2015-01-17 LAB — HM PAP SMEAR

## 2015-01-18 LAB — CYTOLOGY - PAP

## 2015-01-22 ENCOUNTER — Encounter: Payer: Self-pay | Admitting: Internal Medicine

## 2015-03-16 ENCOUNTER — Other Ambulatory Visit: Payer: Self-pay | Admitting: Obstetrics and Gynecology

## 2015-03-16 HISTORY — PX: COLPOSCOPY: SHX161

## 2015-09-19 ENCOUNTER — Encounter: Payer: Self-pay | Admitting: Internal Medicine

## 2015-09-19 ENCOUNTER — Ambulatory Visit (INDEPENDENT_AMBULATORY_CARE_PROVIDER_SITE_OTHER): Payer: BLUE CROSS/BLUE SHIELD | Admitting: Internal Medicine

## 2015-09-19 VITALS — BP 110/76 | HR 67 | Temp 97.8°F | Ht 60.0 in | Wt 109.4 lb

## 2015-09-19 DIAGNOSIS — Z114 Encounter for screening for human immunodeficiency virus [HIV]: Secondary | ICD-10-CM | POA: Diagnosis not present

## 2015-09-19 DIAGNOSIS — Z0000 Encounter for general adult medical examination without abnormal findings: Secondary | ICD-10-CM

## 2015-09-19 NOTE — Assessment & Plan Note (Addendum)
Td 2015 Gyn-- [facility]ib[facility] [place]  Labs: HIV, CMP, FLP, CBC, TSH Diet-exercise discussed

## 2015-09-19 NOTE — Patient Instructions (Signed)
GO TO THE LAB : Get the blood work     GO TO THE FRONT DESK Schedule your next appointment for a  Physical in 2 years

## 2015-09-19 NOTE — Progress Notes (Signed)
Subjective:    Patient ID: Ashley PandyElvia Lopez, female    DOB: 11/28/1980, 35 y.o.   MRN: [id number]  DOS:  09/19/2015 Type of visit - description : CPX Interval history: No major concerns today    Review of Systems Constitutional: No fever. No chills. No unexplained wt changes. No unusual sweats  HEENT: No dental problems, no ear discharge, no facial swelling, no voice changes. No eye discharge, no eye  redness , no  intolerance to light   Respiratory: No wheezing , no  difficulty breathing. No cough , no mucus production  Cardiovascular: No CP, no leg swelling , no  Palpitations  GI: no nausea, no vomiting, no diarrhea , no  abdominal pain.  No blood in the stools. No dysphagia, no odynophagia    Endocrine: No polyphagia, no polyuria , no polydipsia  GU: No dysuria, gross hematuria, difficulty urinating. No urinary urgency, no frequency.  Musculoskeletal: No joint swellings or unusual aches or pains  Skin: No change in the color of the skin, palor . Occasional itchy rash on and off  Allergic, immunologic: No environmental allergies , no  food allergies  Neurological: No dizziness no  syncope. No headaches. No diplopia, no slurred, no slurred speech, no motor deficits, no facial  Numbness  Hematological: No enlarged lymph nodes, no easy bruising , no unusual bleedings  Psychiatry: No suicidal ideas, no hallucinations, no beavior problems, no confusion.  No unusual/severe anxiety, no depression   Past Medical History  Diagnosis Date  . 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 Cesarean section      x 2  . Colposcopy  03/16/2015    Social History   Social History  . Marital Status: Single    Spouse Name: N/A  . Number of Children: 2  . Years of Education: N/A   Occupational History  . factory    Social History Main Topics  . Smoking status: Never Smoker   . Smokeless tobacco: Never Used   . Alcohol Use: No  . Drug Use: No  . Sexual Activity: Yes    Birth Control/ Protection: None   Other Topics Concern  . Not on file   Social History Narrative   Original from [place]   Lives w/ 2 children   G3P2           Family History  Problem Relation Age of Onset  . Colon cancer Neg Hx   . Breast cancer Neg Hx   . CAD Father     ??  . Stroke Other     MGM  . Diabetes Neg Hx       Medication List    Notice  As of 09/19/2015 11:59 PM   You have not been prescribed any medications.         Objective:   Physical Exam BP 110/76 mmHg  Pulse 67  Temp(Src) 97.8 F (36.6 C) (Oral)  Ht 5' (1.524 m)  Wt 109 lb 6 oz (49.612 kg)  BMI 21.36 kg/m2  SpO2 97%  LMP 09/18/2015 (Exact Date)  General:   Well developed, well nourished . NAD.  Neck: No  thyromegaly  HEENT:  Normocephalic . Face symmetric, atraumatic Lungs:  CTA B Normal respiratory effort, no intercostal retractions, no accessory muscle use. Heart: RRR,  no murmur.  No pretibial edema bilaterally  Abdomen:  Not distended, soft, non-tender. No rebound or  rigidity.   Skin: Exposed areas without rash. Not pale. Not jaundice Neurologic:  alert & oriented X3.  Speech normal, gait appropriate for age and unassisted Strength symmetric and appropriate for age.  Psych: Cognition and judgment appear intact.  Cooperative with normal attention span and concentration.  Behavior appropriate. No anxious or depressed appearing.    Assessment & Plan:   Assessment + HPV, abnormal paps, colposcopy 2017 H/o miscarriage Recurrent rash, saw derm ~10-2014, rx topical antifungals, they were considering oral antifungals as well   PLAN  Here for a CPX, she seems to be doing well, she sees her gynecologist yearly. RTC 2 years

## 2015-09-19 NOTE — Progress Notes (Signed)
Pre visit review using our clinic review tool, if applicable. No additional management support is needed unless otherwise documented below in the visit note. 

## 2015-09-20 DIAGNOSIS — Z09 Encounter for follow-up examination after completed treatment for conditions other than malignant neoplasm: Secondary | ICD-10-CM | POA: Insufficient documentation

## 2015-09-20 LAB — CBC WITH DIFFERENTIAL/PLATELET
BASOS ABS: 0 10*3/uL (ref 0.0–0.1)
Basophils Relative: 0.3 % (ref 0.0–3.0)
EOS ABS: 0.1 10*3/uL (ref 0.0–0.7)
Eosinophils Relative: 1 % (ref 0.0–5.0)
HEMATOCRIT: 37 % (ref 36.0–46.0)
HEMOGLOBIN: 12.4 g/dL (ref 12.0–15.0)
LYMPHS PCT: 16.7 % (ref 12.0–46.0)
Lymphs Abs: 1.6 10*3/uL (ref 0.7–4.0)
MCHC: 33.5 g/dL (ref 30.0–36.0)
MCV: 93.1 fl (ref 78.0–100.0)
MONO ABS: 0.6 10*3/uL (ref 0.1–1.0)
Monocytes Relative: 5.8 % (ref 3.0–12.0)
Neutro Abs: 7.3 10*3/uL (ref 1.4–7.7)
Neutrophils Relative %: 76.2 % (ref 43.0–77.0)
Platelets: 255 10*3/uL (ref 150.0–400.0)
RBC: 3.98 Mil/uL (ref 3.87–5.11)
RDW: 12.5 % (ref 11.5–15.5)
WBC: 9.6 10*3/uL (ref 4.0–10.5)

## 2015-09-20 LAB — COMPREHENSIVE METABOLIC PANEL
ALBUMIN: 4.5 g/dL (ref 3.5–5.2)
ALT: 8 U/L (ref 0–35)
AST: 13 U/L (ref 0–37)
Alkaline Phosphatase: 43 U/L (ref 39–117)
BUN: 18 mg/dL (ref 6–23)
CHLORIDE: 94 meq/L — AB (ref 96–112)
CO2: 29 mEq/L (ref 19–32)
CREATININE: 0.61 mg/dL (ref 0.40–1.20)
Calcium: 9.1 mg/dL (ref 8.4–10.5)
GFR: 118.33 mL/min (ref >60.00)
GLUCOSE: 87 mg/dL (ref 70–99)
POTASSIUM: 3.7 meq/L (ref 3.5–5.1)
Sodium: 141 mEq/L (ref 135–145)
Total Bilirubin: 0.9 mg/dL (ref 0.2–1.2)
Total Protein: 7.1 g/dL (ref 6.0–8.3)

## 2015-09-20 LAB — LIPID PANEL
CHOL/HDL RATIO: 3
Cholesterol: 204 mg/dL — ABNORMAL HIGH (ref 0–200)
HDL: 65.8 mg/dL (ref >39.00)
LDL CALC: 129 mg/dL — AB (ref 0–99)
NONHDL: 137.95
Triglycerides: 44 mg/dL (ref 0.0–149.0)
VLDL: 8.8 mg/dL (ref 0.0–40.0)

## 2015-09-20 LAB — HIV ANTIBODY (ROUTINE TESTING W REFLEX): HIV: NONREACTIVE

## 2015-09-20 LAB — TSH: TSH: 0.39 u[IU]/mL (ref 0.35–4.50)

## 2015-09-20 NOTE — Assessment & Plan Note (Signed)
Here for a CPX, she seems to be doing well, she sees her gynecologist yearly. RTC 2 years

## 2015-12-04 DIAGNOSIS — Z6821 Body mass index (BMI) 21.0-21.9, adult: Secondary | ICD-10-CM | POA: Diagnosis not present

## 2015-12-04 DIAGNOSIS — Z3040 Encounter for surveillance of contraceptives, unspecified: Secondary | ICD-10-CM | POA: Diagnosis not present

## 2016-02-20 ENCOUNTER — Other Ambulatory Visit: Payer: Self-pay | Admitting: Obstetrics and Gynecology

## 2016-02-20 DIAGNOSIS — Z124 Encounter for screening for malignant neoplasm of cervix: Secondary | ICD-10-CM | POA: Diagnosis not present

## 2016-02-20 DIAGNOSIS — Z6821 Body mass index (BMI) 21.0-21.9, adult: Secondary | ICD-10-CM | POA: Diagnosis not present

## 2016-02-20 DIAGNOSIS — Z01419 Encounter for gynecological examination (general) (routine) without abnormal findings: Secondary | ICD-10-CM | POA: Diagnosis not present

## 2016-02-20 LAB — HM PAP SMEAR

## 2016-02-21 ENCOUNTER — Encounter: Payer: Self-pay | Admitting: Internal Medicine

## 2016-02-21 LAB — CYTOLOGY - PAP

## 2017-01-27 ENCOUNTER — Encounter: Payer: Self-pay | Admitting: Internal Medicine

## 2017-01-27 ENCOUNTER — Ambulatory Visit: Payer: BLUE CROSS/BLUE SHIELD | Admitting: Internal Medicine

## 2017-01-27 VITALS — BP 118/68 | HR 82 | Temp 98.1°F | Resp 14 | Ht 60.0 in | Wt 114.4 lb

## 2017-01-27 DIAGNOSIS — J40 Bronchitis, not specified as acute or chronic: Secondary | ICD-10-CM | POA: Diagnosis not present

## 2017-01-27 MED ORDER — AZITHROMYCIN 250 MG PO TABS: ORAL_TABLET | ORAL | 0 refills | Status: DC

## 2017-01-27 NOTE — Progress Notes (Signed)
Pre visit review using our clinic review tool, if applicable. No additional management support is needed unless otherwise documented below in the visit note. 

## 2017-01-27 NOTE — Progress Notes (Signed)
Subjective:    Patient ID: Ashley PandyElvia Lopez, female    DOB: 03/04/1981, 36 y.o.   MRN: [id number]  DOS:  01/27/2017 Type of visit - description : acute Interval history: Respiratory symptoms started 4 weeks ago with cough and chest congestion From time to time she is able to bring a pale-looking sputum. Initially had some sore throat. Her 2 children started with respiratory symptoms a few days ago.  They are not febrile.   Review of Systems No fever chills No nausea vomiting Sinuses without congestion or pain. Has developed mild headache in the last 2-3 days, usually in the afternoons.   Past Medical History:  Diagnosis Date  . HPV test positive    2015, 2016, atypical squamous cells    Past Surgical History:  Procedure Laterality Date  . CESAREAN SECTION     x 2  . COLPOSCOPY  03/16/2015    Social History   Socioeconomic History  . Marital status: Single    Spouse name: Not on file  . Number of children: 2  . Years of education: Not on file  . Highest education level: Not on file  Social Needs  . Financial resource strain: Not on file  . Food insecurity - worry: Not on file  . Food insecurity - inability: Not on file  . Transportation needs - medical: Not on file  . Transportation needs - non-medical: Not on file  Occupational History  . Occupation: factory  Tobacco Use  . Smoking status: Never Smoker  . Smokeless tobacco: Never Used  Substance and Sexual Activity  . Alcohol use: No  . Drug use: No  . Sexual activity: Yes    Birth control/protection: None  Other Topics Concern  . Not on file  Social History Narrative   Original from [place]   Lives w/ 2 children   G3P2         Allergies as of 01/27/2017   No Known Allergies     Medication List        Accurate as of 01/27/17  5:56 PM. Always use your most recent med list.          azithromycin 250 MG tablet Commonly known as:  ZITHROMAX Z-PAK 2 tabs a day the first day, then 1  tab a day x 4 days          Objective:   Physical Exam BP 118/68 (BP Location: Left Arm, Patient Position: Sitting, Cuff Size: Small)   Pulse 82   Temp 98.1 F (36.7 C) (Oral)   Resp 14   Ht 5' (1.524 m)   Wt 114 lb 6 oz (51.9 kg)   SpO2 98%   BMI 22.34 kg/m  General:   Well developed, well nourished . NAD.  HEENT:  Normocephalic . Face symmetric, atraumatic.  TMs normal, throat no red, symmetric.  Nose is slightly congested.  Sinuses non-TTP Lungs:  + Rhonchi bilaterally, mild, only with cough.  No wheezing Normal respiratory effort, no intercostal retractions, no accessory muscle use. Heart: RRR,  no murmur.  No pretibial edema bilaterally  Skin: Not pale. Not jaundice Neurologic:  alert & oriented X3.  Speech normal, gait appropriate for age and unassisted Psych--  Cognition and judgment appear intact.  Cooperative with normal attention span and concentration.  Behavior appropriate. No anxious or depressed appearing.      Assessment & Plan:    Assessment + HPV, abnormal paps, colposcopy 2017 H/o miscarriage Recurrent rash, saw derm ~10-2014, rx  topical antifungals, they were considering oral antifungals as well   PLAN  Bronchitis:   respiratory sxs for 4 weeks, suspect bronchitis, no wheezing on exam.  Recommend Mucinex DM, Zithromax, call if not gradually improving.  Also has a mild headache for 3 days, only in the afternoons, recommend Tylenol PM and call if severe headache

## 2017-01-27 NOTE — Patient Instructions (Signed)
Rest, fluids , tylenol  For cough:  Take Mucinex DM twice a day as needed until better  If  nasal congestion: Use OTC  Flonase : 2 nasal sprays on each side of the nose in the morning until you feel better   Take the antibiotic as prescribed  (zithromax)  Call if not gradually better over the next  10 days  Call anytime if the symptoms are severe

## 2017-01-27 NOTE — Assessment & Plan Note (Signed)
Bronchitis:   respiratory sxs for 4 weeks, suspect bronchitis, no wheezing on exam.  Recommend Mucinex DM, Zithromax, call if not gradually improving.  Also has a mild headache for 3 days, only in the afternoons, recommend Tylenol PM and call if severe headache

## 2017-02-05 ENCOUNTER — Telehealth: Payer: Self-pay | Admitting: Internal Medicine

## 2017-02-05 NOTE — Telephone Encounter (Signed)
Copied from CRM [phone]. Topic: Quick Communication - See Telephone Encounter >> Feb 05, 2017  2:59 PM Oneal GroutSebastian, Jennifer S wrote: CRM for notification. See Telephone encounter for: Patient was seen on 01/27/17, still not feeling well. Cough still sounds bad. Please advise  02/05/17.

## 2017-02-06 NOTE — Telephone Encounter (Signed)
Noted! Thank you

## 2017-02-06 NOTE — Telephone Encounter (Signed)
Call to patient- she states she never used the Mucinex (she forgot)- she has improved from her visit but the cough remains. Patient does have productive cough and reports no fever. Offered appointment- patient declines for now. She is advised to use OTC treatment for cough. Increase her fluids, moist heat in house, call back if not better next week for appointment.

## 2017-05-05 ENCOUNTER — Encounter: Payer: Self-pay | Admitting: Internal Medicine

## 2017-05-05 ENCOUNTER — Ambulatory Visit: Payer: BLUE CROSS/BLUE SHIELD | Admitting: Internal Medicine

## 2017-05-05 VITALS — BP 108/74 | HR 99 | Temp 97.9°F | Resp 14 | Ht 60.0 in | Wt 112.2 lb

## 2017-05-05 DIAGNOSIS — B349 Viral infection, unspecified: Secondary | ICD-10-CM | POA: Diagnosis not present

## 2017-05-05 DIAGNOSIS — R52 Pain, unspecified: Secondary | ICD-10-CM | POA: Diagnosis not present

## 2017-05-05 LAB — POC INFLUENZA A&B (BINAX/QUICKVUE)
INFLUENZA B, POC: NEGATIVE
Influenza A, POC: NEGATIVE

## 2017-05-05 NOTE — Progress Notes (Signed)
   Subjective:    Patient ID: Ashley PandyElvia Acosta-Diaz, female    DOB: 08/21/1980, 37 y.o.   MRN: [id number]  DOS:  05/05/2017 Type of visit - description : Acute visit Interval history: Symptoms started today with mild but generalized myalgias, sore throat, pressure to both eyes. Some cough . Her son has fever, went to see the doctor last week, strep and influenza test were negative, still febrile.   Review of Systems Patient denies fever herself. Some nausea this morning, no vomiting or diarrhea No rash  Past Medical History:  Diagnosis Date  . HPV test positive    2015, 2016, atypical squamous cells    Past Surgical History:  Procedure Laterality Date  . CESAREAN SECTION     x 2  . COLPOSCOPY  03/16/2015    Social History   Socioeconomic History  . Marital status: Single    Spouse name: Not on file  . Number of children: 2  . Years of education: Not on file  . Highest education level: Not on file  Social Needs  . Financial resource strain: Not on file  . Food insecurity - worry: Not on file  . Food insecurity - inability: Not on file  . Transportation needs - medical: Not on file  . Transportation needs - non-medical: Not on file  Occupational History  . Occupation: factory  Tobacco Use  . Smoking status: Never Smoker  . Smokeless tobacco: Never Used  Substance and Sexual Activity  . Alcohol use: No  . Drug use: No  . Sexual activity: Yes    Birth control/protection: None  Other Topics Concern  . Not on file  Social History Narrative   Original from [place]   Lives w/ 2 children   G3P2         Allergies as of 05/05/2017   No Known Allergies     Medication List    as of 05/05/2017 11:59 PM   You have not been prescribed any medications.        Objective:   Physical Exam BP 108/74 (BP Location: Right Arm, Patient Position: Sitting, Cuff Size: Small)   Pulse 99   Temp 97.9 F (36.6 C) (Oral)   Resp 14   Ht 5' (1.524 m)   Wt 112 lb 4 oz  (50.9 kg)   LMP 05/03/2017 (Approximate)   SpO2 99%   BMI 21.92 kg/m  General:   Well developed, well nourished . NAD.  HEENT:  Normocephalic . Face symmetric, atraumatic.  EOMI, pupils equal and reactive, conjunctivas not injected Throat: Symmetric, no redness or discharge.  Nose slightly congested, is normal. Lungs:  CTA B Normal respiratory effort, no intercostal retractions, no accessory muscle use. Heart: RRR,  no murmur.  No pretibial edema bilaterally  Skin: Not pale. Not jaundice Neurologic:  alert & oriented X3.  Speech normal, gait appropriate for age and unassisted Psych--  Cognition and judgment appear intact.  Cooperative with normal attention span and concentration.  Behavior appropriate. No anxious or depressed appearing.      Assessment & Plan:   Assessment + HPV, abnormal paps, colposcopy 2017 H/o miscarriage Recurrent rash, saw derm ~10-2014, rx topical antifungals, they were considering oral antifungals as well   PLAN  Viral syndrome: Flu test negative, likely has a viral syndrome,see instructions

## 2017-05-05 NOTE — Progress Notes (Signed)
Pre visit review using our clinic review tool, if applicable. No additional management support is needed unless otherwise documented below in the visit note. 

## 2017-05-05 NOTE — Patient Instructions (Signed)
Rest, fluids , tylenol  For cough:  Take Mucinex DM twice a day as needed until better  For nasal congestion: Use OTC Nasocort or Flonase : 2 nasal sprays on each side of the nose in the morning until you feel better  Call if not gradually better over the next  10 days  Call anytime if the symptoms are severe, you have high fever, short of breath, chest pain   

## 2017-05-06 NOTE — Assessment & Plan Note (Signed)
Viral syndrome: Flu test negative, likely has a viral syndrome,see instructions

## 2017-05-11 DIAGNOSIS — H5211 Myopia, right eye: Secondary | ICD-10-CM | POA: Diagnosis not present

## 2017-05-11 DIAGNOSIS — H5213 Myopia, bilateral: Secondary | ICD-10-CM | POA: Diagnosis not present

## 2018-01-28 ENCOUNTER — Encounter: Payer: Self-pay | Admitting: Internal Medicine

## 2018-01-28 DIAGNOSIS — Z124 Encounter for screening for malignant neoplasm of cervix: Secondary | ICD-10-CM | POA: Diagnosis not present

## 2018-01-28 DIAGNOSIS — Z6820 Body mass index (BMI) 20.0-20.9, adult: Secondary | ICD-10-CM | POA: Diagnosis not present

## 2018-01-28 DIAGNOSIS — Z01419 Encounter for gynecological examination (general) (routine) without abnormal findings: Secondary | ICD-10-CM | POA: Diagnosis not present

## 2018-01-28 LAB — HM PAP SMEAR

## 2018-01-29 ENCOUNTER — Ambulatory Visit (INDEPENDENT_AMBULATORY_CARE_PROVIDER_SITE_OTHER): Payer: BLUE CROSS/BLUE SHIELD

## 2018-01-29 DIAGNOSIS — Z23 Encounter for immunization: Secondary | ICD-10-CM | POA: Diagnosis not present

## 2018-06-25 ENCOUNTER — Telehealth: Payer: Self-pay

## 2018-06-25 NOTE — Telephone Encounter (Signed)
Last routine visit 2017. Ashley Lopez- can you set up virtual visit (okay for cpe).

## 2018-06-25 NOTE — Telephone Encounter (Signed)
LVM in spanish for pt to schedule VOV for her cpe since pt has [employer].

## 2018-06-25 NOTE — Telephone Encounter (Signed)
Pt will call back later to schedule

## 2018-10-01 ENCOUNTER — Encounter: Payer: Self-pay | Admitting: Internal Medicine

## 2019-01-06 ENCOUNTER — Other Ambulatory Visit: Payer: Self-pay

## 2019-01-06 ENCOUNTER — Ambulatory Visit (INDEPENDENT_AMBULATORY_CARE_PROVIDER_SITE_OTHER): Payer: BC Managed Care – PPO

## 2019-01-06 DIAGNOSIS — Z23 Encounter for immunization: Secondary | ICD-10-CM

## 2020-02-10 DIAGNOSIS — Z01419 Encounter for gynecological examination (general) (routine) without abnormal findings: Secondary | ICD-10-CM | POA: Diagnosis not present

## 2020-02-10 DIAGNOSIS — N926 Irregular menstruation, unspecified: Secondary | ICD-10-CM | POA: Diagnosis not present

## 2021-02-06 ENCOUNTER — Ambulatory Visit: Payer: BC Managed Care – PPO | Admitting: Medical

## 2021-04-05 ENCOUNTER — Ambulatory Visit: Payer: BC Managed Care – PPO | Admitting: Medical

## 2021-05-02 ENCOUNTER — Encounter: Payer: Self-pay | Admitting: Medical

## 2021-05-02 ENCOUNTER — Ambulatory Visit: Payer: BC Managed Care – PPO | Admitting: Medical

## 2021-05-02 VITALS — BP 103/69 | HR 81 | Temp 97.9°F | Resp 16 | Ht 60.7 in | Wt 113.0 lb

## 2021-05-02 DIAGNOSIS — R0982 Postnasal drip: Secondary | ICD-10-CM

## 2021-05-02 DIAGNOSIS — R5383 Other fatigue: Secondary | ICD-10-CM

## 2021-05-02 DIAGNOSIS — Z0000 Encounter for general adult medical examination without abnormal findings: Secondary | ICD-10-CM

## 2021-05-02 DIAGNOSIS — K121 Other forms of stomatitis: Secondary | ICD-10-CM

## 2021-05-02 LAB — CBC WITH DIFFERENTIAL/PLATELET
Basophils Absolute: 0 10*3/uL (ref 0.0–0.1)
Basophils Relative: 1 % (ref 0.0–3.0)
Eosinophils Absolute: 0.1 10*3/uL (ref 0.0–0.7)
Eosinophils Relative: 1.9 % (ref 0.0–5.0)
HCT: 36.2 % (ref 36.0–46.0)
Hemoglobin: 12.3 g/dL (ref 12.0–15.0)
Lymphocytes Relative: 25.1 % (ref 12.0–46.0)
Lymphs Abs: 1.1 10*3/uL (ref 0.7–4.0)
MCHC: 34 g/dL (ref 30.0–36.0)
MCV: 93.1 fl (ref 78.0–100.0)
Monocytes Absolute: 0.4 10*3/uL (ref 0.1–1.0)
Monocytes Relative: 8.8 % (ref 3.0–12.0)
Neutro Abs: 2.8 10*3/uL (ref 1.4–7.7)
Neutrophils Relative %: 63.2 % (ref 43.0–77.0)
Platelets: 291 10*3/uL (ref 150.0–400.0)
RBC: 3.89 Mil/uL (ref 3.87–5.11)
RDW: 13.1 % (ref 11.5–15.5)
WBC: 4.5 10*3/uL (ref 4.0–10.5)

## 2021-05-02 LAB — TSH: TSH: 0.44 u[IU]/mL (ref 0.35–5.50)

## 2021-05-02 LAB — LIPID PANEL
Cholesterol: 227 mg/dL — ABNORMAL HIGH (ref 0–200)
HDL: 68.6 mg/dL (ref >39.00)
LDL Cholesterol: 145 mg/dL — ABNORMAL HIGH (ref 0–99)
NonHDL: 158.25
Total CHOL/HDL Ratio: 3
Triglycerides: 65 mg/dL (ref 0.0–149.0)
VLDL: 13 mg/dL (ref 0.0–40.0)

## 2021-05-02 LAB — COMPREHENSIVE METABOLIC PANEL
ALT: 9 U/L (ref 0–35)
AST: 15 U/L (ref 0–37)
Albumin: 4.3 g/dL (ref 3.5–5.2)
Alkaline Phosphatase: 41 U/L (ref 39–117)
BUN: 18 mg/dL (ref 6–23)
CO2: 31 mEq/L (ref 19–32)
Calcium: 8.7 mg/dL (ref 8.4–10.5)
Chloride: 103 mEq/L (ref 96–112)
Creatinine, Ser: 0.62 mg/dL (ref 0.40–1.20)
GFR: 110.9 mL/min (ref >60.00)
Glucose, Bld: 83 mg/dL (ref 70–99)
Potassium: 4.3 mEq/L (ref 3.5–5.1)
Sodium: 137 mEq/L (ref 135–145)
Total Bilirubin: 1.1 mg/dL (ref 0.2–1.2)
Total Protein: 6.6 g/dL (ref 6.0–8.3)

## 2021-05-02 LAB — VITAMIN B12: Vitamin B-12: 238 pg/mL (ref 211–911)

## 2021-05-02 LAB — T4, FREE: Free T4: 0.84 ng/dL (ref 0.60–1.60)

## 2021-05-02 MED ORDER — LEVOCETIRIZINE DIHYDROCHLORIDE 5 MG PO TABS: 5.0000 mg | ORAL_TABLET | Freq: Every evening | ORAL | 3 refills | Status: AC

## 2021-05-02 NOTE — Patient Instructions (Addendum)
For you wellness exam today I have ordered cbc, cmp and  lipid panel.   Vaccine up to date. Consider annual covid booster.   Recommend exercise and healthy diet.  We will let you know lab results as they come in.  Follow up date appointment will be determined after lab review.    When you get pap have gyn office fax the results.  Episodes of transient stomatitis and fatigue. Will get b12, b1, tsh and t4.   For describe possible post nasal drainage from allergies rx xyzal antihistamine.  Preventive Care 71-79 Years Old, Female Preventive care refers to lifestyle choices and visits with your health care provider that can promote health and wellness. Preventive care visits are also called wellness exams. What can I expect for my preventive care visit? Counseling Your health care provider may ask you questions about your: Medical history, including: Past medical problems. Family medical history. Pregnancy history. Current health, including: Menstrual cycle. Method of birth control. Emotional well-being. Home life and relationship well-being. Sexual activity and sexual health. Lifestyle, including: Alcohol, nicotine or tobacco, and drug use. Access to firearms. Diet, exercise, and sleep habits. Work and work Statistician. Sunscreen use. Safety issues such as seatbelt and bike helmet use. Physical exam Your health care provider will check your: Height and weight. These may be used to calculate your BMI (body mass index). BMI is a measurement that tells if you are at a healthy weight. Waist circumference. This measures the distance around your waistline. This measurement also tells if you are at a healthy weight and may help predict your risk of certain diseases, such as type 2 diabetes and high blood pressure. Heart rate and blood pressure. Body temperature. Skin for abnormal spots. What immunizations do I need? Vaccines are usually given at various ages, according to a  schedule. Your health care provider will recommend vaccines for you based on your age, medical history, and lifestyle or other factors, such as travel or where you work. What tests do I need? Screening Your health care provider may recommend screening tests for certain conditions. This may include: Lipid and cholesterol levels. Diabetes screening. This is done by checking your blood sugar (glucose) after you have not eaten for a while (fasting). Pelvic exam and Pap test. Hepatitis B test. Hepatitis C test. HIV (human immunodeficiency virus) test. STI (sexually transmitted infection) testing, if you are at risk. Lung cancer screening. Colorectal cancer screening. Mammogram. Talk with your health care provider about when you should start having regular mammograms. This may depend on whether you have a family history of breast cancer. BRCA-related cancer screening. This may be done if you have a family history of breast, ovarian, tubal, or peritoneal cancers. Bone density scan. This is done to screen for osteoporosis. Talk with your health care provider about your test results, treatment options, and if necessary, the need for more tests. Follow these instructions at home: Eating and drinking  Eat a diet that includes fresh fruits and vegetables, whole grains, lean protein, and low-fat dairy products. Take vitamin and mineral supplements as recommended by your health care provider. Do not drink alcohol if: Your health care provider tells you not to drink. You are pregnant, may be pregnant, or are planning to become pregnant. If you drink alcohol: Limit how much you have to 0-1 drink a day. Know how much alcohol is in your drink. In the [place] one drink equals one 12 oz bottle of beer (355 mL), one 5 oz glass of  wine (148 mL), or one 1 oz glass of hard liquor (44 mL). Lifestyle Brush your teeth every morning and night with fluoride toothpaste. Floss one time each day. Exercise for at least  30 minutes 5 or more days each week. Do not use any products that contain nicotine or tobacco. These products include cigarettes, chewing tobacco, and vaping devices, such as e-cigarettes. If you need help quitting, ask your health care provider. Do not use drugs. If you are sexually active, practice safe sex. Use a condom or other form of protection to prevent STIs. If you do not wish to become pregnant, use a form of birth control. If you plan to become pregnant, see your health care provider for a prepregnancy visit. Take aspirin only as told by your health care provider. Make sure that you understand how much to take and what form to take. Work with your health care provider to find out whether it is safe and beneficial for you to take aspirin daily. Find healthy ways to manage stress, such as: Meditation, yoga, or listening to music. Journaling. Talking to a trusted person. Spending time with friends and family. Minimize exposure to UV radiation to reduce your risk of skin cancer. Safety Always wear your seat belt while driving or riding in a vehicle. Do not drive: If you have been drinking alcohol. Do not ride with someone who has been drinking. When you are tired or distracted. While texting. If you have been using any mind-altering substances or drugs. Wear a helmet and other protective equipment during sports activities. If you have firearms in your house, make sure you follow all gun safety procedures. Seek help if you have been physically or sexually abused. What's next? Visit your health care provider once a year for an annual wellness visit. Ask your health care provider how often you should have your eyes and teeth checked. Stay up to date on all vaccines. This information is not intended to replace advice given to you by your health care provider. Make sure you discuss any questions you have with your health care provider. Document Revised: 08/22/2020 Document Reviewed:  08/22/2020 Elsevier Patient Education  [place].

## 2021-05-02 NOTE — Progress Notes (Signed)
Subjective:    Patient ID: Ashley Lopez, female    DOB: 1980/07/22, 41 y.o.   MRN: [id number]  HPI  Pt in for first time.  Pt works [employer]. Not exercising. Moderate healthy diet. Non smoker. Non smoker. 2 children 82 and 64 yo. 1 cup of coffee.  Pt is fasting.  Up to date on pap smear.  Declined flu vaccine.  Lmp- Apr 28, 2021  Review of Systems  Constitutional:  Positive for fatigue. Negative for chills and fever.  HENT:  Positive for postnasal drip. Negative for congestion and drooling.        Occasional small area of tenderness inside of mouth. Sensitive to food at times. On and off.   Also some chronic pnd.  Respiratory:  Negative for cough, chest tightness, shortness of breath and wheezing.   Cardiovascular:  Negative for chest pain and palpitations.  Gastrointestinal:  Negative for abdominal pain, blood in stool, constipation and diarrhea.  Genitourinary:  Negative for difficulty urinating, dysuria, flank pain and frequency.  Musculoskeletal:  Negative for back pain.  Neurological:  Negative for dizziness, speech difficulty, weakness, light-headedness, numbness and headaches.  Hematological:  Negative for adenopathy. Does not bruise/bleed easily.  Psychiatric/Behavioral:  Negative for behavioral problems and confusion.     Past Medical History:  Diagnosis Date   HPV test positive    2015, 2016, atypical squamous cells     Social History   Socioeconomic History   Marital status: Single    Spouse name: Not on file   Number of children: 2   Years of education: Not on file   Highest education level: Not on file  Occupational History   Occupation: factory  Tobacco Use   Smoking status: Never   Smokeless tobacco: Never  Vaping Use   Vaping Use: Never used  Substance and Sexual Activity   Alcohol use: No   Drug use: No   Sexual activity: Yes    Birth control/protection: None  Other Topics Concern   Not on file  Social History Narrative    Original from [place], [place]   Lives w/ 2 children   G3P2      Social Determinants of Health   Financial Resource Strain: Not on file  Food Insecurity: Not on file  Transportation Needs: Not on file  Physical Activity: Not on file  Stress: Not on file  Social Connections: Not on file  Intimate Partner Violence: Not on file    Past Surgical History:  Procedure Laterality Date   CESAREAN SECTION     x 2   COLPOSCOPY  03/16/2015    Family History  Problem Relation Age of Onset   Hypertension Mother    CAD Father        ??   Vision loss Paternal Grandfather    Colon cancer Neg Hx    Breast cancer Neg Hx    Diabetes Neg Hx     No Known Allergies  No current outpatient medications on file prior to visit.   No current facility-administered medications on file prior to visit.    BP 103/69 (BP Location: Right Arm, Patient Position: Sitting, Cuff Size: Small)    Pulse 81    Temp 97.9 F (36.6 C) (Oral)    Resp 16    Ht 5' 0.7" (1.542 m)    Wt 113 lb (51.3 kg)    SpO2 100%    BMI 21.56 kg/m       Objective:   Physical Exam  General Mental Status- Alert. General Appearance- Not in acute distress.   Skin General: Color- Normal Color. Moisture- Normal Moisture.  Neck Carotid Arteries- Normal color. Moisture- Normal Moisture. No carotid bruits. No JVD.  Chest and Lung Exam Auscultation: Breath Sounds:-Normal.  Cardiovascular Auscultation:Rythm- Regular. Murmurs & Other Heart Sounds:Auscultation of the heart reveals- No Murmurs.  Abdomen Inspection:-Inspeection Normal. Palpation/Percussion:Note:No mass. Palpation and Percussion of the abdomen reveal- Non Tender, Non Distended + BS, no rebound or guarding.  Neurologic Cranial Nerve exam:- CN III-XII intact(No nystagmus), symmetric smile. Strength:- 5/5 equal and symmetric strength both upper and lower extremities.   Mouth- buccal mucosal normal.     Assessment & Plan:   Patient Instructions  For you  wellness exam today I have ordered cbc, cmp and  lipid panel.   Vaccine up to date. Consider annual covid booster.   Recommend exercise and healthy diet.  We will let you know lab results as they come in.  Follow up date appointment will be determined after lab review.    When you get pap have gyn office fax the results.  Episodes of transient stomatitis and fatigue. Will get b12, b1, tsh and t4.   For describe possible post nasal drainage from allergies rx xyzal antihistamine.      Esperanza Richters, PA-C

## 2021-05-06 LAB — VITAMIN B1: Vitamin B1 (Thiamine): 8 nmol/L (ref 8–30)

## 2021-05-31 DIAGNOSIS — Z01419 Encounter for gynecological examination (general) (routine) without abnormal findings: Secondary | ICD-10-CM | POA: Diagnosis not present

## 2021-05-31 DIAGNOSIS — Z124 Encounter for screening for malignant neoplasm of cervix: Secondary | ICD-10-CM | POA: Diagnosis not present

## 2021-05-31 DIAGNOSIS — Z6822 Body mass index (BMI) 22.0-22.9, adult: Secondary | ICD-10-CM | POA: Diagnosis not present

## 2021-06-05 ENCOUNTER — Ambulatory Visit: Payer: BC Managed Care – PPO | Admitting: Medical

## 2021-06-19 ENCOUNTER — Ambulatory Visit: Payer: BC Managed Care – PPO | Admitting: Medical

## 2021-06-19 VITALS — BP 90/60 | HR 74 | Temp 98.5°F | Resp 18 | Ht 60.0 in | Wt 116.2 lb

## 2021-06-19 DIAGNOSIS — R5383 Other fatigue: Secondary | ICD-10-CM | POA: Diagnosis not present

## 2021-06-19 DIAGNOSIS — E785 Hyperlipidemia, unspecified: Secondary | ICD-10-CM

## 2021-06-19 DIAGNOSIS — E538 Deficiency of other specified B group vitamins: Secondary | ICD-10-CM | POA: Diagnosis not present

## 2021-06-19 LAB — VITAMIN B12: Vitamin B-12: >1504 pg/mL — ABNORMAL HIGH (ref 211–911)

## 2021-06-19 NOTE — Patient Instructions (Addendum)
Low b12 and history of fatigue. Other lab work up did not reveal cause. Will check your b12 level now after taking otc supplament. Advise on continuing dose/level after review. ? ?High cholesterol- recommend low cholesterol diet and exercise. Discussed on cardiovascular risk to avoid htn, diabetes etc. Below is your risk score presently. ? ?The 10-year ASCVD risk score (Arnett DK, et al., 2019) is: 0.3% ?  Values used to calculate the score: ?    Age: 41 years ?    Sex: Female ?    Is Non-Hispanic African American: No ?    Diabetic: No ?    Tobacco smoker: No ?    Systolic Blood Pressure: 90 mmHg ?    Is BP treated: No ?    HDL Cholesterol: 68.6 mg/dL ?    Total Cholesterol: 227 mg/dL  ? ?Follow up 6 months or sooner if needed. Come in fasting for that appointment and will get lipid. ? ? ?

## 2021-06-19 NOTE — Progress Notes (Signed)
? ?Subjective:  ? ? Patient ID: Ashley Lopez, female    DOB: Dec 27, 1980, 41 y.o.   MRN: [id number] ? ?HPI ?Pt has lower end b12 level on recent labs. Pt has been using b12 otc daily but states sometimes forgets to use. She is using 5000 mcg dose daily. Pt has been using b12 for about 4-5 weeks. Ordered for fatigue. ? ?Pt also had cholesterol elevation. Pt mom has high cholesterol. Is not smoker.Pt does admit to eating fried foods. ? ? ? ? ?Review of Systems  ?Constitutional:  Positive for fatigue. Negative for chills and fever.  ?     Still some fatigue.  ?Respiratory:  Negative for cough, chest tightness, shortness of breath and wheezing.   ?Cardiovascular:  Negative for chest pain and palpitations.  ?Gastrointestinal:  Negative for abdominal distention, anal bleeding, constipation and diarrhea.  ?Musculoskeletal:  Negative for back pain.  ?Neurological:  Negative for dizziness, speech difficulty, weakness and headaches.  ?Hematological:  Negative for adenopathy. Does not bruise/bleed easily.  ?Psychiatric/Behavioral:  Negative for behavioral problems and confusion.   ? ? ?Past Medical History:  ?Diagnosis Date  ? HPV test positive   ? 2015, 2016, atypical squamous cells  ? ?  ?Social History  ? ?Socioeconomic History  ? Marital status: Single  ?  Spouse name: Not on file  ? Number of children: 2  ? Years of education: Not on file  ? Highest education level: Not on file  ?Occupational History  ? Occupation: factory  ?Tobacco Use  ? Smoking status: Never  ? Smokeless tobacco: Never  ?Vaping Use  ? Vaping Use: Never used  ?Substance and Sexual Activity  ? Alcohol use: No  ? Drug use: No  ? Sexual activity: Yes  ?  Birth control/protection: None  ?Other Topics Concern  ? Not on file  ?Social History Narrative  ? Original from [place], [place]  ? Lives w/ 2 children  ? G3P2  ?   ? ?Social Determinants of Health  ? ?Financial Resource Strain: Not on file  ?Food Insecurity: Not on file  ?Transportation Needs: Not on  file  ?Physical Activity: Not on file  ?Stress: Not on file  ?Social Connections: Not on file  ?Intimate Partner Violence: Not on file  ? ? ?Past Surgical History:  ?Procedure Laterality Date  ? CESAREAN SECTION    ? x 2  ? COLPOSCOPY  03/16/2015  ? ? ?Family History  ?Problem Relation Age of Onset  ? Hypertension Mother   ? CAD Father   ?     ??  ? Vision loss Paternal Grandfather   ? Colon cancer Neg Hx   ? Breast cancer Neg Hx   ? Diabetes Neg Hx   ? ? ?No Known Allergies ? ?Current Outpatient Medications on File Prior to Visit  ?Medication Sig Dispense Refill  ? levocetirizine (XYZAL) 5 MG tablet Take 1 tablet (5 mg total) by mouth every evening. 30 tablet 3  ? ?No current facility-administered medications on file prior to visit.  ? ? ?BP 90/60   Pulse 74   Temp 98.5 ?F (36.9 ?C)   Resp 18   Ht 5' (1.524 m)   Wt 116 lb 3.2 oz (52.7 kg)   SpO2 100%   BMI 22.69 kg/m?  ?  ?   ?Objective:  ? Physical Exam ? ? ?General ?Mental Status- Alert. General Appearance- Not in acute distress.  ? ?Skin ?General: Color- Normal Color. Moisture- Normal Moisture. ? ?Neck ?Carotid Arteries-  Normal color. Moisture- Normal Moisture. No carotid bruits. No JVD. ? ?Chest and Lung Exam ?Auscultation: ?Breath Sounds:-Normal. ? ?Cardiovascular ?Auscultation:Rythm- Regular. ?Murmurs & Other Heart Sounds:Auscultation of the heart reveals- No Murmurs. ? ?Abdomen ?Inspection:-Inspeection Normal. ?Palpation/Percussion:Note:No mass. Palpation and Percussion of the abdomen reveal- Non Tender, Non Distended + BS, no rebound or guarding. ? ? ?Neurologic ?Cranial Nerve exam:- CN III-XII intact(No nystagmus), symmetric smile. ?Strength:- 5/5 equal and symmetric strength both upper and lower extremities.  ? ?   ?Assessment & Plan:  ? ?Patient Instructions  ?Low b12 and history of fatigue. Other lab work up did not reveal cause. Will check your b12 level now after taking otc supplament. Advise on continuing dose/level after review. ? ?High  cholesterol- recommend low cholesterol diet and exercise. Discussed on cardiovascular risk to avoid htn, diabetes etc. Below is your risk score presently. ? ?The 10-year ASCVD risk score (Arnett DK, et al., 2019) is: 0.3% ?  Values used to calculate the score: ?    Age: 20 years ?    Sex: Female ?    Is Non-Hispanic African American: No ?    Diabetic: No ?    Tobacco smoker: No ?    Systolic Blood Pressure: 90 mmHg ?    Is BP treated: No ?    HDL Cholesterol: 68.6 mg/dL ?    Total Cholesterol: 227 mg/dL  ? ?Follow up 6 months or sooner if needed. Come in fasting for that appointment and will get lipid. ? ?  ?Esperanza Richters, PA-C  ?

## 2021-06-20 NOTE — Progress Notes (Signed)
Patient advised of results and provider's recommendations.  ? ?This conversation was in spanish ?

## 2021-07-10 DIAGNOSIS — Z1231 Encounter for screening mammogram for malignant neoplasm of breast: Secondary | ICD-10-CM | POA: Diagnosis not present

## 2021-07-16 ENCOUNTER — Other Ambulatory Visit: Payer: Self-pay | Admitting: Obstetrics and Gynecology

## 2021-07-16 DIAGNOSIS — R928 Other abnormal and inconclusive findings on diagnostic imaging of breast: Secondary | ICD-10-CM

## 2021-07-31 ENCOUNTER — Other Ambulatory Visit: Payer: Self-pay | Admitting: Obstetrics and Gynecology

## 2021-07-31 ENCOUNTER — Ambulatory Visit
Admission: RE | Admit: 2021-07-31 | Discharge: 2021-07-31 | Disposition: A | Discharge status: Home / Self Care | Payer: BC Managed Care – PPO | Source: Ambulatory Visit | Attending: Obstetrics and Gynecology | Admitting: Obstetrics and Gynecology

## 2021-07-31 DIAGNOSIS — N6321 Unspecified lump in the left breast, upper outer quadrant: Secondary | ICD-10-CM | POA: Diagnosis not present

## 2021-07-31 DIAGNOSIS — N6322 Unspecified lump in the left breast, upper inner quadrant: Secondary | ICD-10-CM | POA: Diagnosis not present

## 2021-07-31 DIAGNOSIS — N6320 Unspecified lump in the left breast, unspecified quadrant: Secondary | ICD-10-CM

## 2021-07-31 DIAGNOSIS — R928 Other abnormal and inconclusive findings on diagnostic imaging of breast: Secondary | ICD-10-CM

## 2021-07-31 DIAGNOSIS — R922 Inconclusive mammogram: Secondary | ICD-10-CM | POA: Diagnosis not present

## 2021-12-19 ENCOUNTER — Ambulatory Visit: Payer: BC Managed Care – PPO | Admitting: Medical

## 2022-01-08 ENCOUNTER — Ambulatory Visit: Payer: BC Managed Care – PPO | Admitting: Medical

## 2022-01-08 VITALS — BP 109/60 | HR 80 | Temp 98.2°F | Resp 18 | Ht 60.0 in | Wt 115.0 lb

## 2022-01-08 DIAGNOSIS — Z23 Encounter for immunization: Secondary | ICD-10-CM

## 2022-01-08 DIAGNOSIS — E785 Hyperlipidemia, unspecified: Secondary | ICD-10-CM | POA: Diagnosis not present

## 2022-01-08 DIAGNOSIS — R21 Rash and other nonspecific skin eruption: Secondary | ICD-10-CM

## 2022-01-08 DIAGNOSIS — E538 Deficiency of other specified B group vitamins: Secondary | ICD-10-CM | POA: Diagnosis not present

## 2022-01-08 MED ORDER — KETOCONAZOLE 2 % EX CREA: 1.0000 | TOPICAL_CREAM | Freq: Two times a day (BID) | CUTANEOUS | 0 refills | Status: DC

## 2022-01-08 NOTE — Progress Notes (Signed)
Subjective:    Patient ID: Ashley Lopez, female    DOB: 1980/12/21, 41 y.o.   MRN: [id number]  HPI  Pt in for follow up.  Last A/P  "Low b12 and history of fatigue. Other lab work up did not reveal cause. Will check your b12 level now after taking otc supplament. Advise on continuing dose/level after review.   High cholesterol- recommend low cholesterol diet and exercise. Discussed on cardiovascular risk to avoid htn, diabetes etc. Below is your risk score presently."  Pt also report rt medial distal thigh rash that came up recently. Pt states some itching intermittently. Pt states imiar rash to when she saw Dr. Larose Kells in the past. On review Dr. Larose Kells prescribed ketoconazole twice daily. Pt later saw dermatologist and appears the agrees with dx of ring room.  Pt b12 in past low. Pt took b12 otc and her levels came back up in April. Pt stopped b12 supplementation 6 months.    Pt also has high cholesterol- pt states eating little bit better low cholesterol diet.  She will get flu vaccine today.   On review up to date on mamogram.  Pt states in march will get pap smear.  Review of Systems  Constitutional:  Negative for chills, fatigue and fever.  HENT:  Negative for congestion and drooling.   Respiratory:  Negative for cough, chest tightness, shortness of breath and wheezing.   Cardiovascular:  Negative for chest pain and palpitations.  Gastrointestinal:  Negative for abdominal pain and constipation.  Genitourinary:  Negative for difficulty urinating, dysuria and flank pain.  Musculoskeletal:  Negative for back pain.  Skin:  Negative for rash.    Past Medical History:  Diagnosis Date   HPV test positive    2015, 2016, atypical squamous cells     Social History   Socioeconomic History   Marital status: Single    Spouse name: Not on file   Number of children: 2   Years of education: Not on file   Highest education level: Not on file  Occupational History   Occupation:  factory  Tobacco Use   Smoking status: Never   Smokeless tobacco: Never  Vaping Use   Vaping Use: Never used  Substance and Sexual Activity   Alcohol use: No   Drug use: No   Sexual activity: Yes    Birth control/protection: None  Other Topics Concern   Not on file  Social History Narrative   Original from [place], [place]   Lives w/ 2 children   G3P2      Social Determinants of Health   Financial Resource Strain: Not on file  Food Insecurity: Not on file  Transportation Needs: Not on file  Physical Activity: Not on file  Stress: Not on file  Social Connections: Not on file  Intimate Partner Violence: Not on file    Past Surgical History:  Procedure Laterality Date   CESAREAN SECTION     x 2   COLPOSCOPY  03/16/2015    Family History  Problem Relation Age of Onset   Hypertension Mother    CAD Father        ??   Vision loss Paternal Grandfather    Colon cancer Neg Hx    Breast cancer Neg Hx    Diabetes Neg Hx     No Known Allergies  Current Outpatient Medications on File Prior to Visit  Medication Sig Dispense Refill   levocetirizine (XYZAL) 5 MG tablet Take 1 tablet (5 mg  total) by mouth every evening. 30 tablet 3   No current facility-administered medications on file prior to visit.    BP 109/60   Pulse 80   Temp 98.2 F (36.8 C)   Resp 18   Ht 5' (1.524 m)   Wt 115 lb (52.2 kg)   SpO2 100%   BMI 22.46 kg/m        Objective:   Physical Exam  General Mental Status- Alert. General Appearance- Not in acute distress.   Skin General: Color- Normal Color. Moisture- Normal Moisture.  Neck Carotid Arteries- Normal color. Moisture- Normal Moisture. No carotid bruits. No JVD.  Chest and Lung Exam Auscultation: Breath Sounds:-Normal.  Cardiovascular Auscultation:Rythm- Regular. Murmurs & Other Heart Sounds:Auscultation of the heart reveals- No Murmurs.  Abdomen Inspection:-Inspeection Normal. Palpation/Percussion:Note:No mass. Palpation  and Percussion of the abdomen reveal- Non Tender, Non Distended + BS, no rebound or guarding.   Neurologic Cranial Nerve exam:- CN III-XII intact(No nystagmus), symmetric smile. Strength:- 5/5 equal and symmetric strength both upper and lower extremities.   Skin- rt medial aspect mid rt thigh. 6 cm rash with rash rim at edge. Also medial aspect below rt knee circular rash about  1cm wide.    Assessment & Plan:   Patient Instructions  Rt lower ext rash. Some feature of tinea corporis/ring worm and had this in past on review. Will rx ketonozole cream twice daily. If rash persists would refer back to dermatologist.  B12 deficiency. Will check your level and see if you need supplamentation again.  High cholesterol-recommend low cholesterol diet and exercise.  Follow up wellness exam February next year or sooner if needed.  Will recheck area in 3 weeks if rash not resolved. Please let me know. Can use my chart or call.    Flu vaccine today.  Esperanza Richters, PA-C

## 2022-01-08 NOTE — Patient Instructions (Addendum)
Rt lower ext rash. Some feature of tinea corporis/ring worm and had this in past on review. Will rx ketonozole cream twice daily. If rash persists would refer back to dermatologist.  B12 deficiency. Will check your level and see if you need supplamentation again.  High cholesterol-recommend low cholesterol diet and exercise.  Follow up wellness exam February next year or sooner if needed.  Flu vaccine today.  Will recheck area in 3 weeks if rash not resolved. Please let me know. Can use my chart or call.

## 2022-01-08 NOTE — Addendum Note (Signed)
Addended by: Jeronimo Greaves on: 01/08/2022 10:02 AM   Modules accepted: Orders

## 2022-02-05 ENCOUNTER — Other Ambulatory Visit: Payer: BC Managed Care – PPO

## 2022-02-21 ENCOUNTER — Other Ambulatory Visit: Payer: BC Managed Care – PPO

## 2022-03-21 ENCOUNTER — Ambulatory Visit
Admission: RE | Admit: 2022-03-21 | Discharge: 2022-03-21 | Disposition: A | Discharge status: Home / Self Care | Payer: BC Managed Care – PPO | Source: Ambulatory Visit | Attending: Obstetrics and Gynecology | Admitting: Obstetrics and Gynecology

## 2022-03-21 DIAGNOSIS — N6320 Unspecified lump in the left breast, unspecified quadrant: Secondary | ICD-10-CM

## 2022-03-21 DIAGNOSIS — N6325 Unspecified lump in the left breast, overlapping quadrants: Secondary | ICD-10-CM | POA: Diagnosis not present

## 2022-03-25 ENCOUNTER — Other Ambulatory Visit: Payer: Self-pay | Admitting: Obstetrics and Gynecology

## 2022-03-25 DIAGNOSIS — N6320 Unspecified lump in the left breast, unspecified quadrant: Secondary | ICD-10-CM

## 2022-07-10 DIAGNOSIS — Z01419 Encounter for gynecological examination (general) (routine) without abnormal findings: Secondary | ICD-10-CM | POA: Diagnosis not present

## 2022-07-10 DIAGNOSIS — Z124 Encounter for screening for malignant neoplasm of cervix: Secondary | ICD-10-CM | POA: Diagnosis not present

## 2022-07-10 DIAGNOSIS — Z6821 Body mass index (BMI) 21.0-21.9, adult: Secondary | ICD-10-CM | POA: Diagnosis not present

## 2022-08-06 ENCOUNTER — Inpatient Hospital Stay: Admission: RE | Admit: 2022-08-06 | Payer: BC Managed Care – PPO | Source: Ambulatory Visit

## 2022-09-24 ENCOUNTER — Other Ambulatory Visit: Payer: Self-pay | Admitting: Medical

## 2022-09-24 DIAGNOSIS — N921 Excessive and frequent menstruation with irregular cycle: Secondary | ICD-10-CM | POA: Diagnosis not present

## 2022-09-24 DIAGNOSIS — Z6822 Body mass index (BMI) 22.0-22.9, adult: Secondary | ICD-10-CM | POA: Diagnosis not present

## 2022-09-26 MED ORDER — KETOCONAZOLE 2 % EX CREA: 1.0000 | TOPICAL_CREAM | Freq: Two times a day (BID) | CUTANEOUS | 0 refills | Status: AC

## 2022-09-26 NOTE — Addendum Note (Signed)
Addended by: Gwenevere Abbot on: 09/26/2022 01:17 PM   Modules accepted: Orders

## 2023-04-03 IMAGING — MG MM DIGITAL DIAGNOSTIC UNILAT*L* W/ TOMO W/ CAD
6 series · 6 of 18 positions shown · non-contrast
Comparison: Previous exam(s).

CLINICAL DATA: Screening recall for a possible left breast mass.

EXAM:
DIGITAL DIAGNOSTIC UNILATERAL LEFT MAMMOGRAM WITH TOMOSYNTHESIS AND
CAD; ULTRASOUND LEFT BREAST LIMITED
TECHNIQUE: Left digital diagnostic mammography and breast tomosynthesis was
performed. The images were evaluated with computer-aided detection.;
Targeted ultrasound examination of the left breast was performed.

[L ML synth-2D]
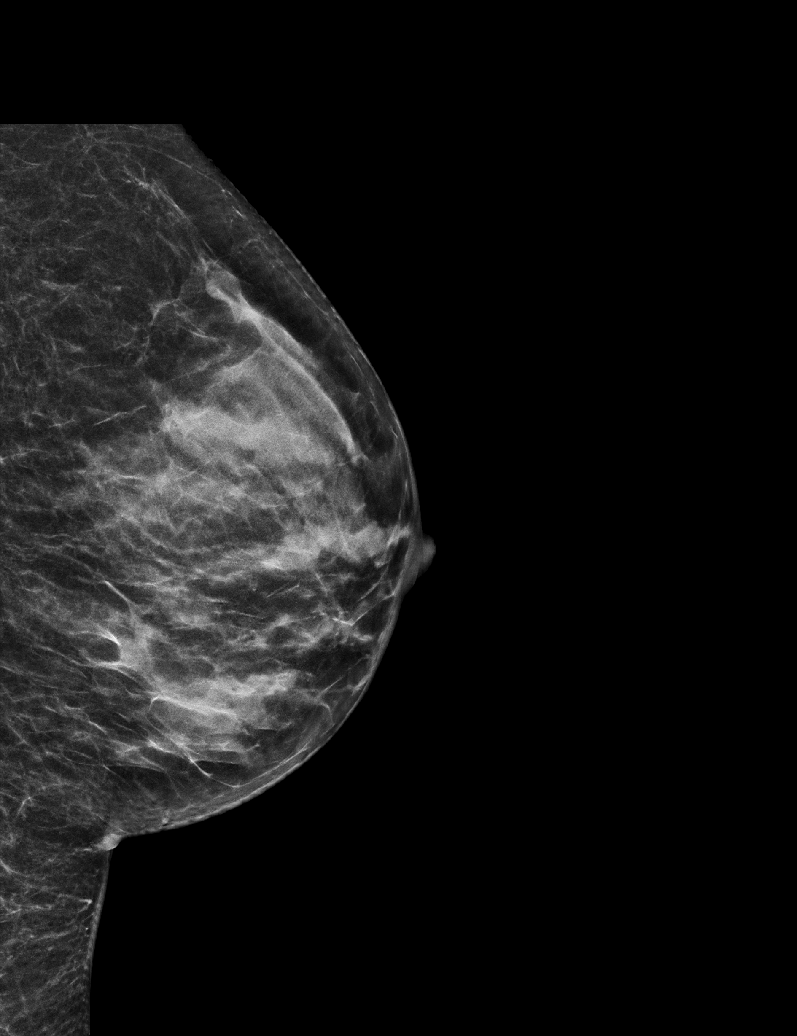

[L CC synth-2D (1 of 2)]
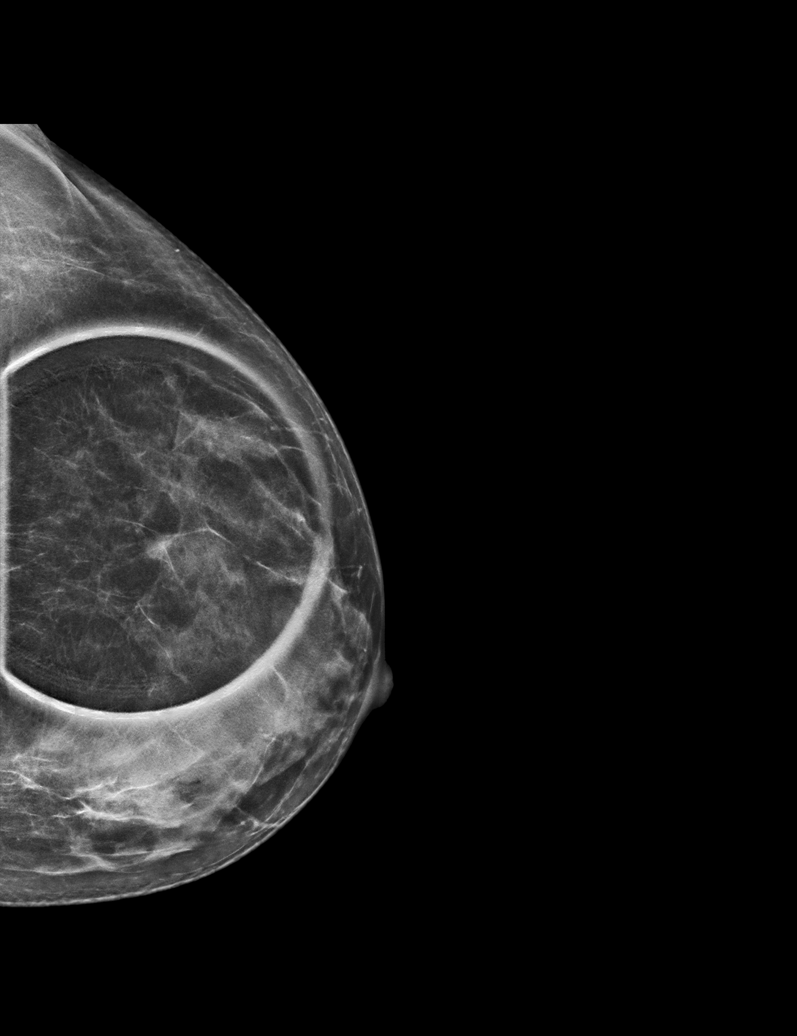

[L CC synth-2D (2 of 2)]
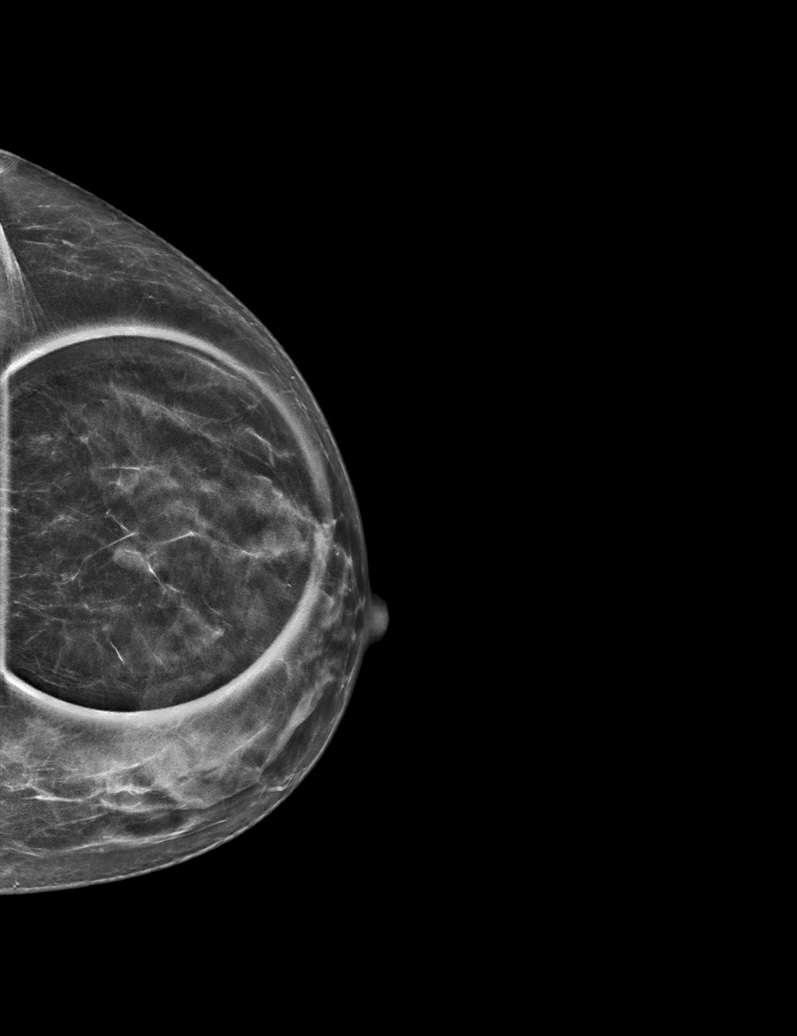

[L CC tomo (1 of 2) · tomo slice 27/52.0]
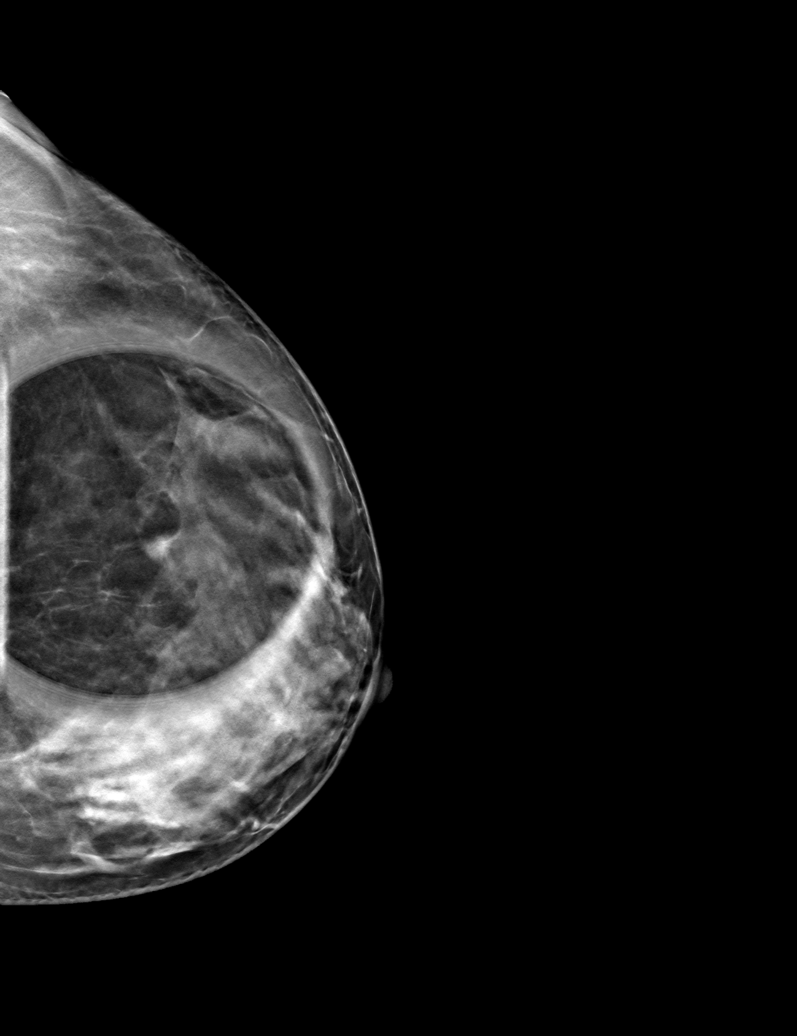

[L ML tomo · tomo slice 25/48.0]
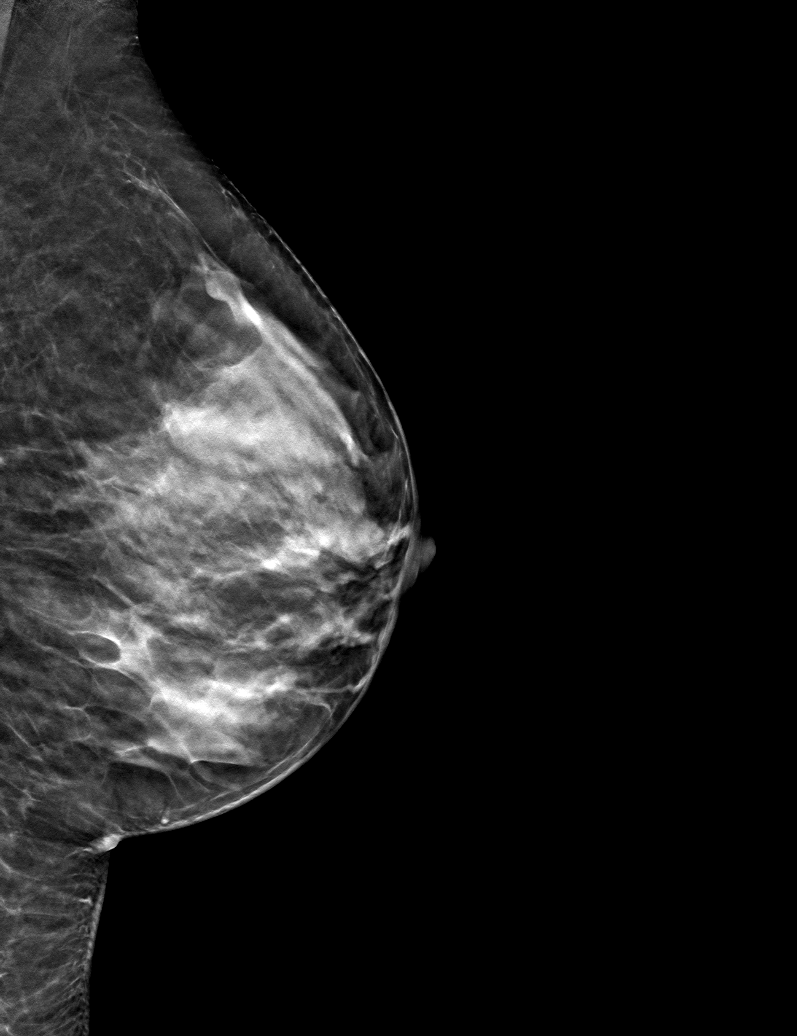

[L CC tomo (2 of 2) · tomo slice 25/50.0]
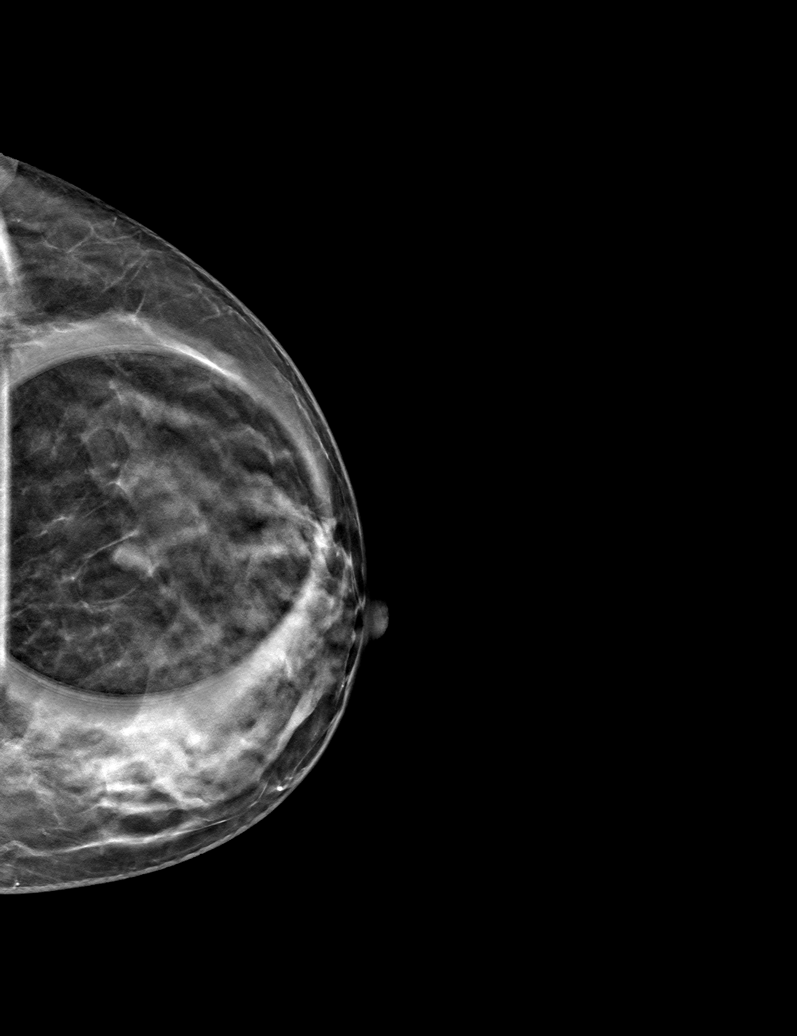

[6 of 18 positions shown; findings below may reference images not displayed]

ACR Breast Density Category d: The breast tissue is extremely dense,
which lowers the sensitivity of mammography.
FINDINGS: Spot compression tomosynthesis images through the superior left
breast demonstrates a persistent obscured oval mass measuring
approximately 6 mm.

Ultrasound targeted to the left breast at 12 o'clock, 3 cm from the
nipple demonstrates a hypoechoic oval circumscribed mass with thin
internal septations measuring 5 x 4 x 5 mm.
IMPRESSION: The 5 mm mass in the left breast at 12 o'clock is likely benign,
favored to represent a fibroadenoma.

RECOMMENDATION:
Six-month follow-up left breast ultrasound.

I have discussed the findings and recommendations with the patient.
If applicable, a reminder letter will be sent to the patient
regarding the next appointment.

BI-RADS CATEGORY  3: Probably benign.

## 2023-04-03 IMAGING — US US BREAST*L* LIMITED INC AXILLA
1 series · 10 of 10 positions shown · non-contrast
Comparison: Previous exam(s).

CLINICAL DATA: Screening recall for a possible left breast mass.

EXAM:
DIGITAL DIAGNOSTIC UNILATERAL LEFT MAMMOGRAM WITH TOMOSYNTHESIS AND
CAD; ULTRASOUND LEFT BREAST LIMITED
TECHNIQUE: Left digital diagnostic mammography and breast tomosynthesis was
performed. The images were evaluated with computer-aided detection.;
Targeted ultrasound examination of the left breast was performed.

[Series 1: us breast*left* limited inc axilla · 0.06mm/px · 10 of 10 slices shown]
[im 1/10]
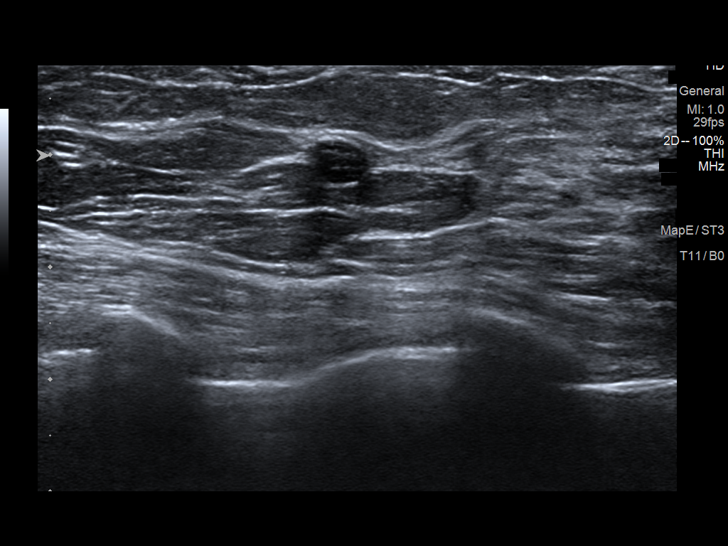
[im 2/10]
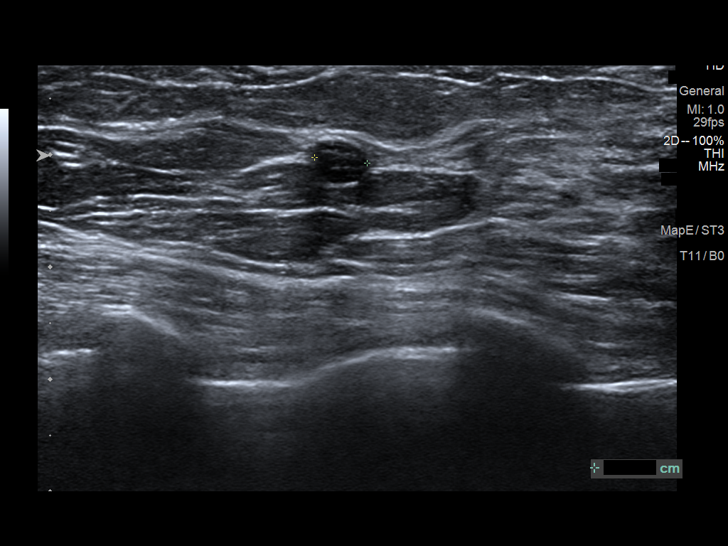
[im 3/10]
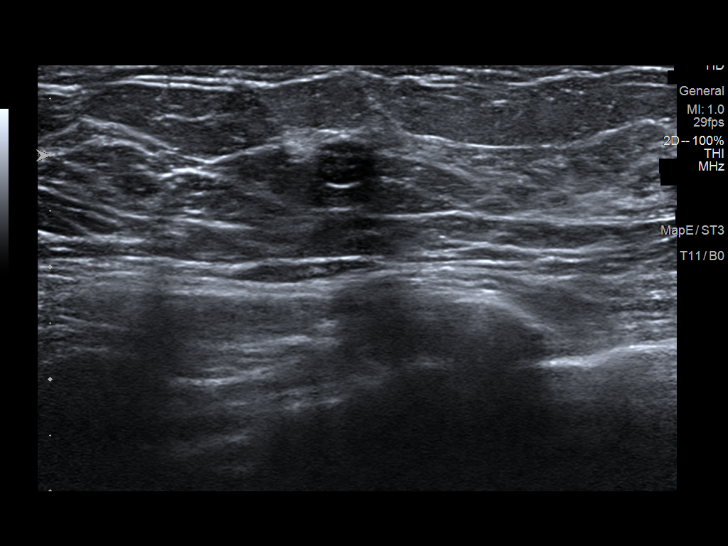
[im 4/10]
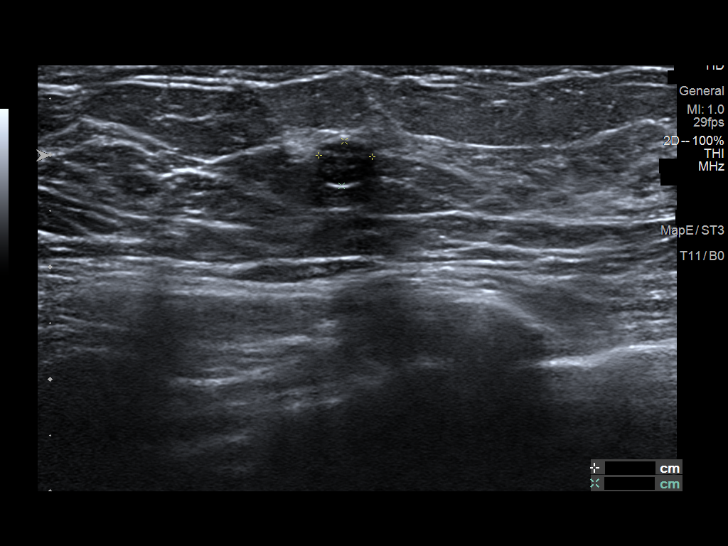
[im 5/10]
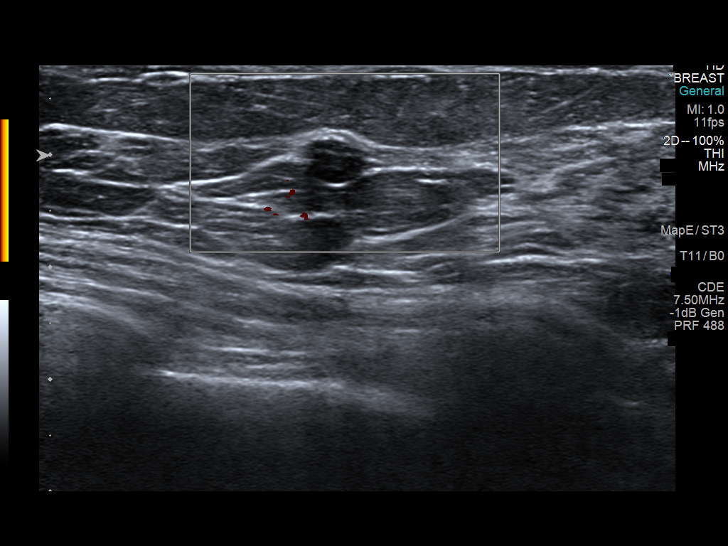
[im 6/10]
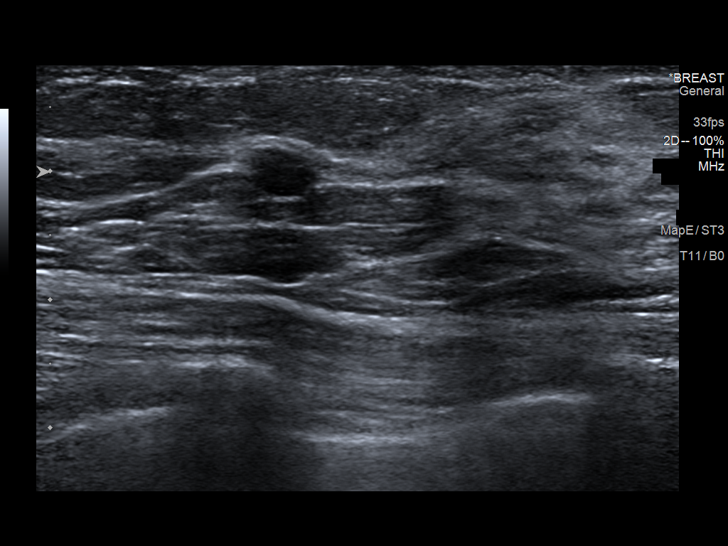
[im 7/10]
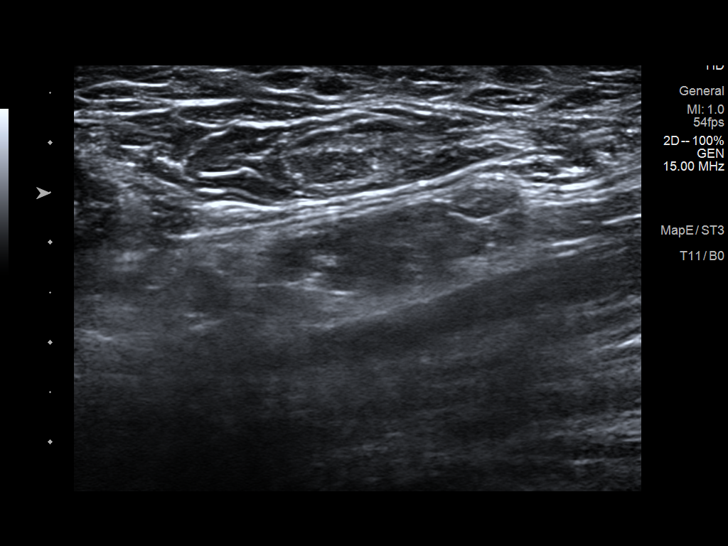
[im 8/10]
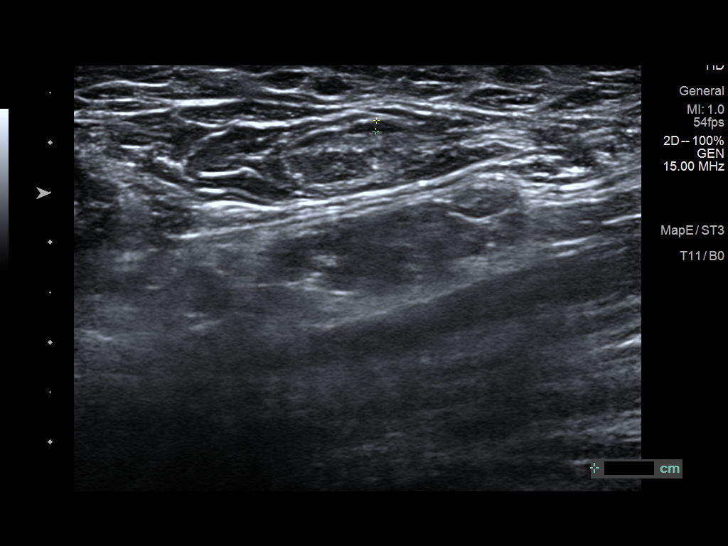
[im 9/10]
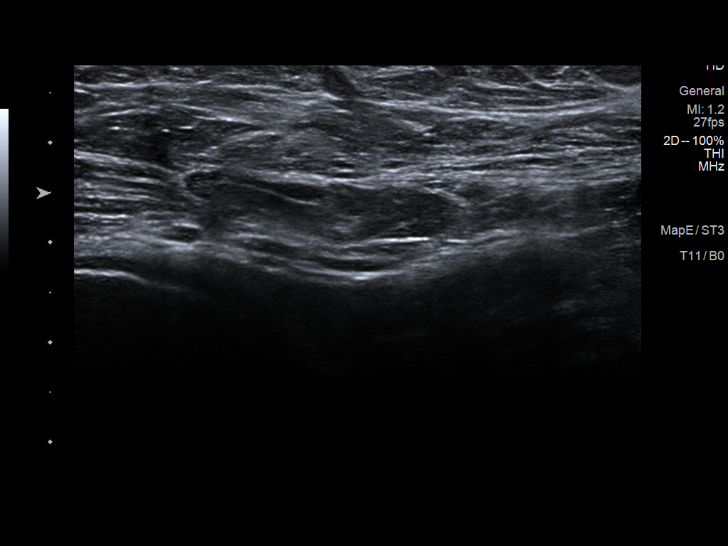
[im 10/10]
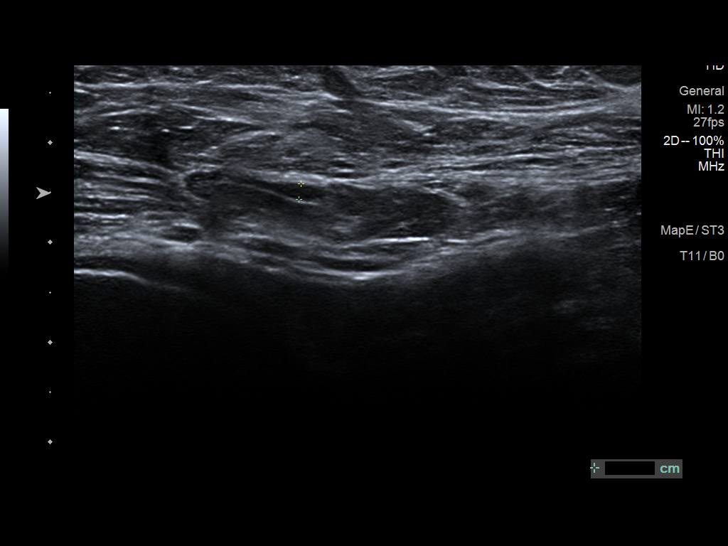

[10 of 10 positions shown; findings below may reference images not displayed]

ACR Breast Density Category d: The breast tissue is extremely dense,
which lowers the sensitivity of mammography.
FINDINGS: Spot compression tomosynthesis images through the superior left
breast demonstrates a persistent obscured oval mass measuring
approximately 6 mm.

Ultrasound targeted to the left breast at 12 o'clock, 3 cm from the
nipple demonstrates a hypoechoic oval circumscribed mass with thin
internal septations measuring 5 x 4 x 5 mm.
IMPRESSION: The 5 mm mass in the left breast at 12 o'clock is likely benign,
favored to represent a fibroadenoma.

RECOMMENDATION:
Six-month follow-up left breast ultrasound.

I have discussed the findings and recommendations with the patient.
If applicable, a reminder letter will be sent to the patient
regarding the next appointment.

BI-RADS CATEGORY  3: Probably benign.
